# Patient Record
Sex: Male | Born: 2000 | Race: White | Hispanic: No | Marital: Single | Smoking: Never smoker
Health system: Southern US, Community
[De-identification: ages and names within clinical notes are randomized; demographics above are authoritative.]

## PROBLEM LIST (undated history)

## (undated) DIAGNOSIS — R011 Cardiac murmur, unspecified: Secondary | ICD-10-CM

## (undated) HISTORY — DX: Cardiac murmur, unspecified: R01.1

---
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
## 2019-02-01 DIAGNOSIS — Z03818 Encounter for observation for suspected exposure to other biological agents ruled out: Secondary | ICD-10-CM | POA: Diagnosis not present

## 2019-02-03 ENCOUNTER — Other Ambulatory Visit: Payer: 59

## 2019-02-04 ENCOUNTER — Other Ambulatory Visit: Payer: Self-pay

## 2019-02-04 ENCOUNTER — Ambulatory Visit: Payer: 59 | Attending: Internal Medicine

## 2019-02-04 ENCOUNTER — Other Ambulatory Visit: Payer: 59

## 2019-02-04 DIAGNOSIS — Z20828 Contact with and (suspected) exposure to other viral communicable diseases: Secondary | ICD-10-CM | POA: Diagnosis not present

## 2019-02-04 DIAGNOSIS — Z20822 Contact with and (suspected) exposure to covid-19: Secondary | ICD-10-CM

## 2019-02-06 LAB — NOVEL CORONAVIRUS, NAA: SARS-CoV-2, NAA: NOT DETECTED

## 2019-03-04 IMAGING — DX DG CHEST 2V
2 series · 2 of 2 positions shown · non-contrast
Comparison: None.

CLINICAL DATA: Chest pain

EXAM:
CHEST - 2 VIEW

[chest pa]
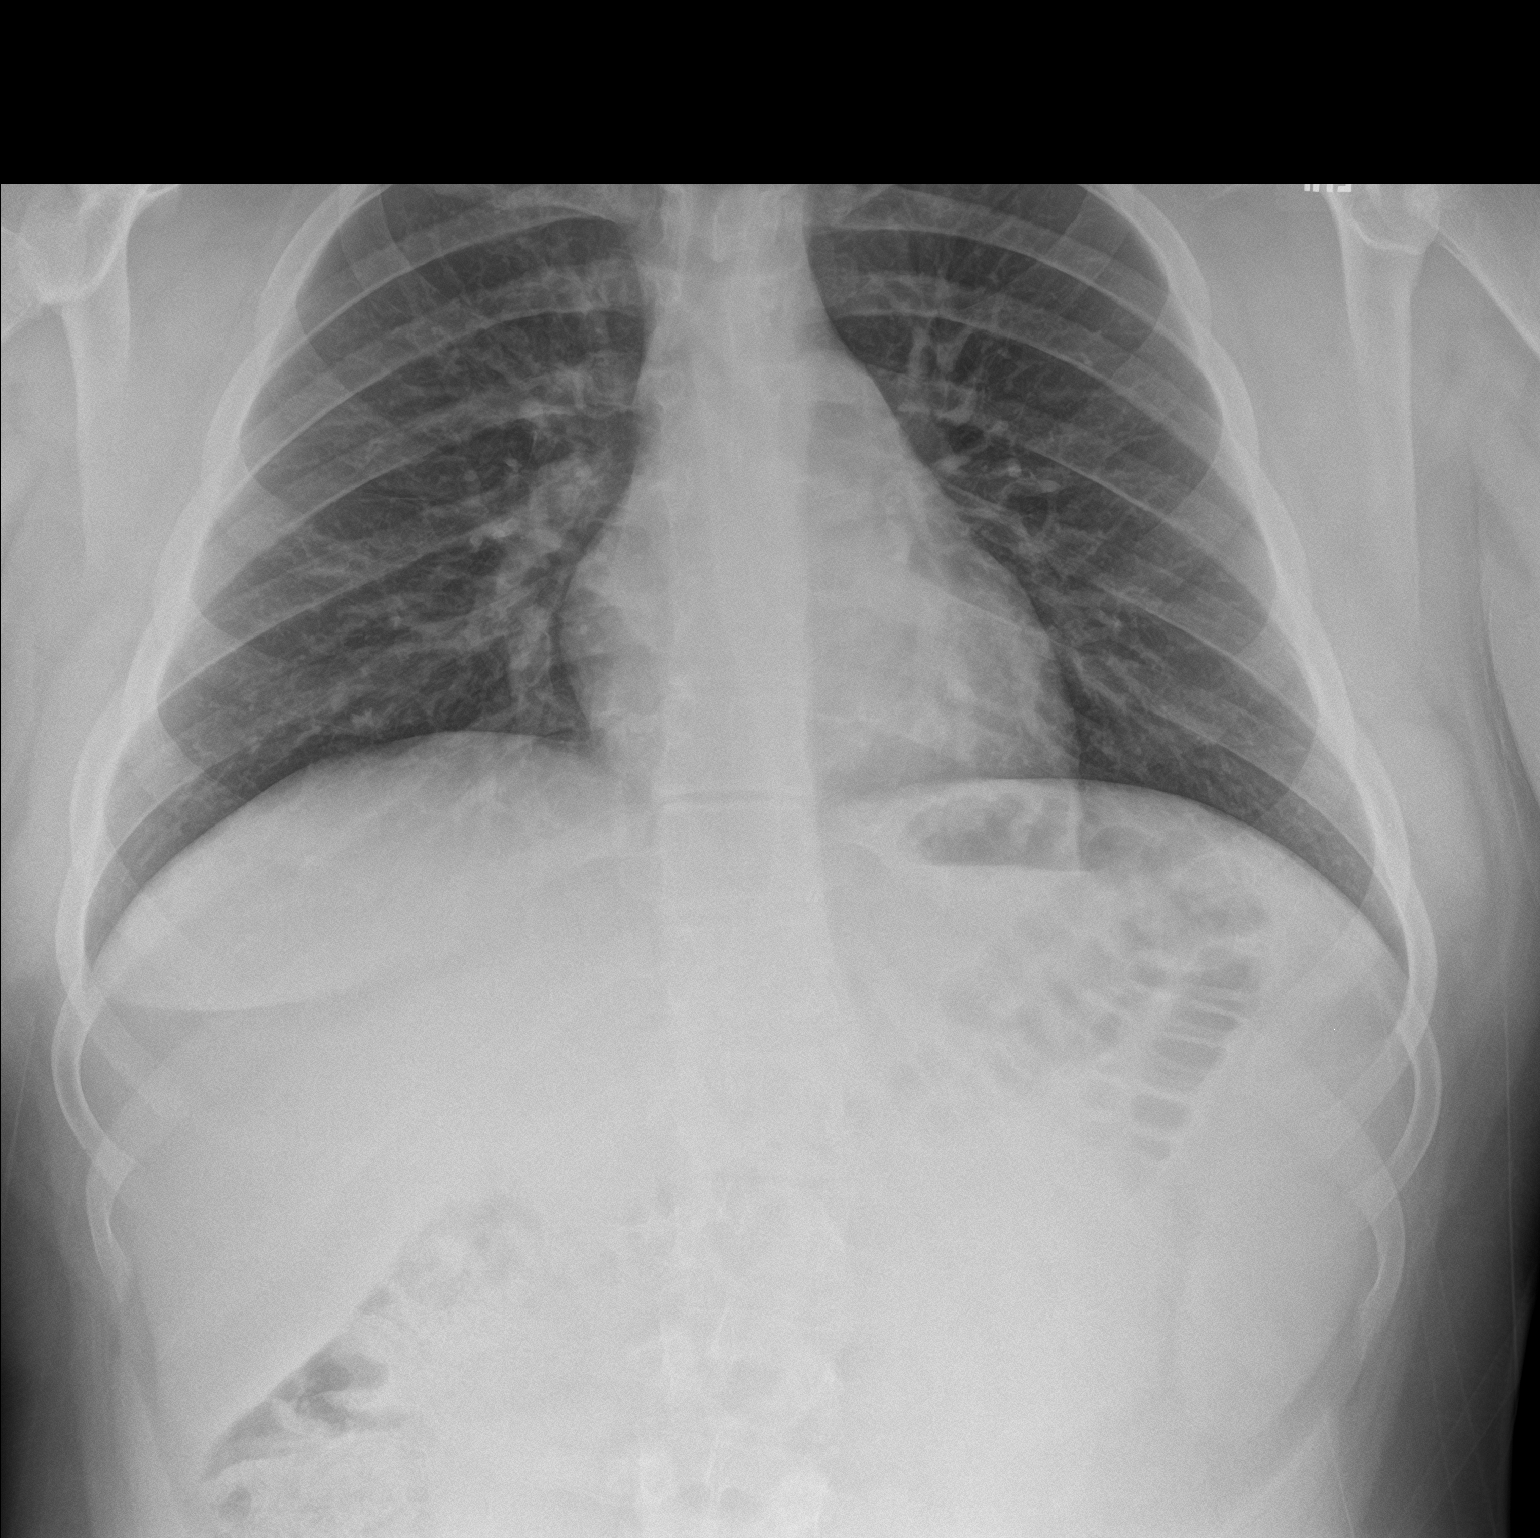

[chest lat]
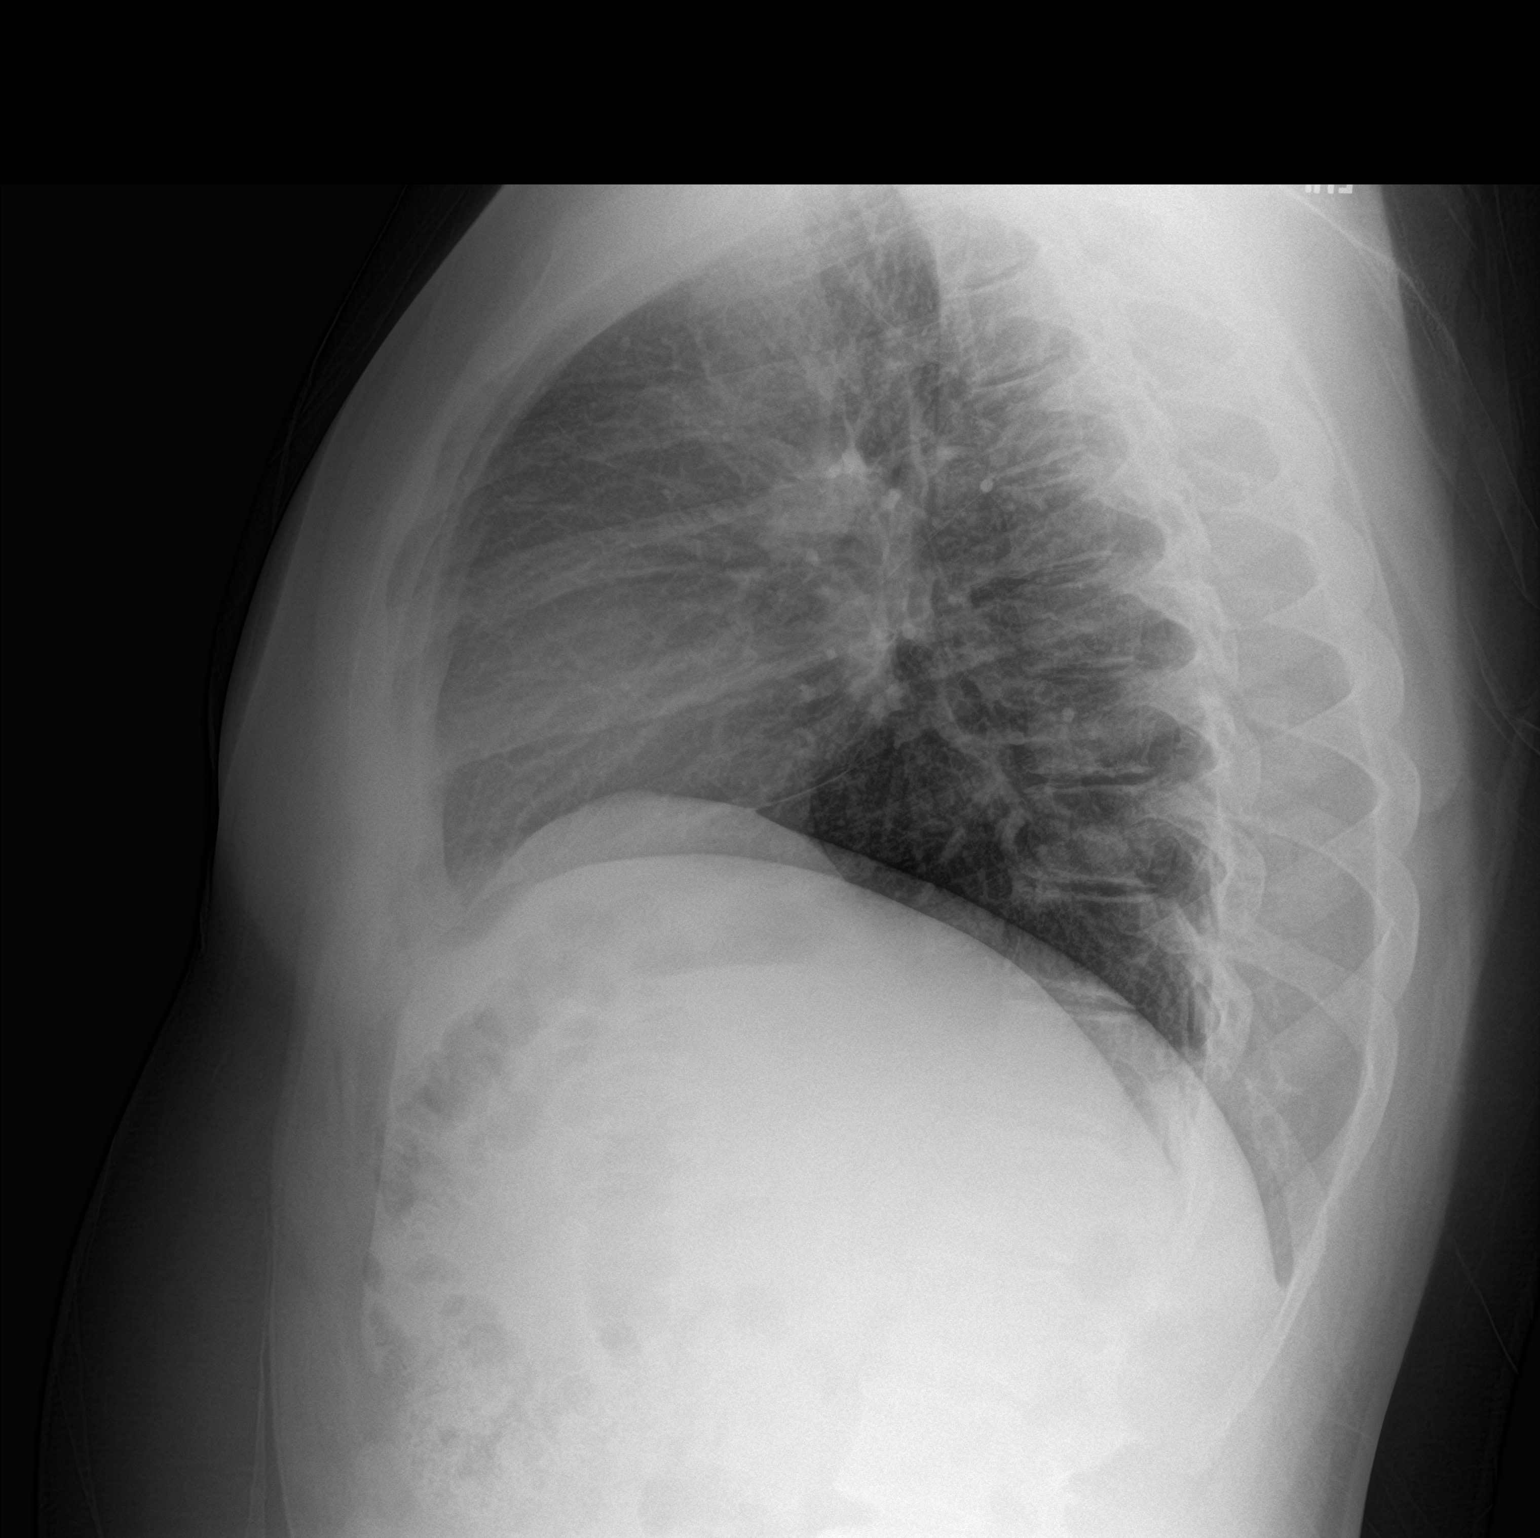

[2 of 2 positions shown; findings below may reference images not displayed]

FINDINGS: No active infiltrate or effusion is seen. Mediastinal and hilar
contours are unremarkable. The heart is within normal limits in
size. No bony abnormality is seen.
IMPRESSION: No active cardiopulmonary disease.

## 2019-03-16 ENCOUNTER — Encounter: Payer: Self-pay | Admitting: Family Medicine

## 2019-03-17 ENCOUNTER — Encounter: Payer: Self-pay | Admitting: Family Medicine

## 2019-09-16 ENCOUNTER — Ambulatory Visit (INDEPENDENT_AMBULATORY_CARE_PROVIDER_SITE_OTHER): Payer: 59 | Admitting: Nurse Practitioner

## 2019-09-16 ENCOUNTER — Other Ambulatory Visit: Payer: Self-pay

## 2019-09-16 VITALS — BP 122/82 | Temp 98.3°F | Wt 194.0 lb

## 2019-09-16 DIAGNOSIS — I499 Cardiac arrhythmia, unspecified: Secondary | ICD-10-CM | POA: Diagnosis not present

## 2019-09-16 NOTE — Progress Notes (Signed)
   Subjective:    Patient ID: David Estrada, male    DOB: 2000-06-02, 19 y.o.   MRN: [id number]  HPI  Patient arrives to discuss palpitations with exercise for several weeks. For the past 5 months patient has been working very hard on healthy habits and has lost significant amount of weight.  Has been jogging and going to the gym.  Eating healthy.  Symptoms began about 2-1/2 weeks ago.  Started with some excessive sweating possible limited hydration which he thinks caused this first episode of symptoms.  Describing the sensation like a light hitting in the middle of his chest with a possible missed beat of his pulse.  Usually occurs when his heart rate is over 160.  The highest his pulse has been with intense activity is in the 170s but he is backed off his activity to keep his heart rate lower.  Noticed it also recently about a week ago while working at his job at [employer], occurred multiple times.  Patient was not doing any significant exertion.  Denies any chest pain.  No syncope.  No edema.  No history of cardiac problems.  No family history of heart rhythm issues.  States he has a significant problem with anxiety.  Symptoms rarely occur at rest, none today.  States his resting heart rate can be in the mid 40s.  Review of Systems     Objective:   Physical Exam NAD.  Alert, oriented.  Mildly anxious affect.  Making good eye contact.  Dressed appropriately.  Thoughts logical coherent and relevant.  Lungs clear.  Heart regular rate rhythm.  Soft early systolic murmur grade 2/6 loudest at third ICS-LSB.  Abdomen soft nondistended nontender.  Lower extremities no edema.  Pulse 76 and regular. Today's Vitals   09/16/19 1509  BP: 122/82  Temp: 98.3 F (36.8 C)  TempSrc: Oral  Weight: 194 lb (88 kg)   Body mass index is 27.06 kg/m.        Assessment & Plan:  Irregular heartbeat - Plan: Ambulatory referral to Cardiology, Comprehensive metabolic panel, TSH, CBC with  Differential/Platelet  Activity as tolerated.  Discontinue activity that brings on any symptoms.  Warning signs reviewed.  Patient to go to ED if any new or worsening symptoms.  Labs pending.  Will refer to cardiology for further work-up.

## 2019-09-17 ENCOUNTER — Encounter: Payer: Self-pay | Admitting: Nurse Practitioner

## 2019-09-17 LAB — COMPREHENSIVE METABOLIC PANEL
ALT: 23 IU/L (ref 0–44)
AST: 23 IU/L (ref 0–40)
Albumin/Globulin Ratio: 1.6 (ref 1.2–2.2)
Albumin: 4.8 g/dL (ref 4.1–5.2)
Alkaline Phosphatase: 115 IU/L (ref 55–125)
BUN/Creatinine Ratio: 19 (ref 9–20)
BUN: 15 mg/dL (ref 6–20)
Bilirubin Total: 0.7 mg/dL (ref 0.0–1.2)
CO2: 24 mmol/L (ref 20–29)
Calcium: 9.6 mg/dL (ref 8.7–10.2)
Chloride: 99 mmol/L (ref 96–106)
Creatinine, Ser: 0.78 mg/dL (ref 0.76–1.27)
GFR calc Af Amer: 150 mL/min/{1.73_m2} (ref >59)
GFR calc non Af Amer: 130 mL/min/{1.73_m2} (ref >59)
Globulin, Total: 3 g/dL (ref 1.5–4.5)
Glucose: 85 mg/dL (ref 65–99)
Potassium: 4.8 mmol/L (ref 3.5–5.2)
Sodium: 138 mmol/L (ref 134–144)
Total Protein: 7.8 g/dL (ref 6.0–8.5)

## 2019-09-17 LAB — CBC WITH DIFFERENTIAL/PLATELET
Basophils Absolute: 0 10*3/uL (ref 0.0–0.2)
Basos: 1 %
EOS (ABSOLUTE): 0.2 10*3/uL (ref 0.0–0.4)
Eos: 2 %
Hematocrit: 50.1 % (ref 37.5–51.0)
Hemoglobin: 17.2 g/dL (ref 13.0–17.7)
Immature Grans (Abs): 0 10*3/uL (ref 0.0–0.1)
Immature Granulocytes: 0 %
Lymphocytes Absolute: 1.8 10*3/uL (ref 0.7–3.1)
Lymphs: 20 %
MCH: 30 pg (ref 26.6–33.0)
MCHC: 34.3 g/dL (ref 31.5–35.7)
MCV: 87 fL (ref 79–97)
Monocytes Absolute: 0.5 10*3/uL (ref 0.1–0.9)
Monocytes: 5 %
Neutrophils Absolute: 6.3 10*3/uL (ref 1.4–7.0)
Neutrophils: 72 %
Platelets: 280 10*3/uL (ref 150–450)
RBC: 5.73 x10E6/uL (ref 4.14–5.80)
RDW: 12.2 % (ref 11.6–15.4)
WBC: 8.7 10*3/uL (ref 3.4–10.8)

## 2019-09-17 LAB — TSH: TSH: 1.62 u[IU]/mL (ref 0.450–4.500)

## 2019-10-30 NOTE — Progress Notes (Signed)
Cardiology Office Note:   Date:  10/31/2019  NAME:  David Estrada    MRN: [id number] DOB:  [birth year]   PCP:  Annalee Genta, DO  Cardiologist:  No primary care provider on file.   Referring MD: Campbell Riches, NP   Chief Complaint  Patient presents with  . Palpitations   History of Present Illness:   David Estrada is a 19 y.o. male without history who is being seen today for the evaluation of palpitations at the request of Annalee Genta, DO. Thyroid studies normal. TSH 1.62.  He presents for evaluation of palpitations.  Roughly 6 months ago he reports he started an intense exercise program and lost nearly 56 pounds.  He would notice his heart skipping a beat when he would run.  He reports symptoms used to occur daily but now occur less.  He started back exercising again and has not noticed any palpitations.  He was told he had a murmur by his primary care physician and then referred to [place].  His EKG demonstrates normal sinus rhythm with no acute ST-T changes or evidence of prior infarction.  He has no history of syncope.  There is no family history of sudden cardiac death.  There is no strong family history of heart disease.  Apparently diabetes and hypertension run in the family.  His most recent thyroid studies were normal.  He does not smoke, use drugs or drink alcohol.  He does have a 2 out of 6 holosystolic murmur present on exam.  No echocardiogram in our system.  EKG shows normal sinus rhythm with no ischemic changes.  He denies chest pain, shortness of breath or palpitations in office.  Past Medical History: Past Medical History:  Diagnosis Date  . Heart murmur     Past Surgical History: History reviewed. No pertinent surgical history.  Current Medications: No outpatient medications have been marked as taking for the 10/31/19 encounter (Office Visit) with Sande Rives, MD.     Allergies:    Patient has no known allergies.   Social History: Social History     Socioeconomic History  . Marital status: Single    Spouse name: Not on file  . Number of children: Not on file  . Years of education: Not on file  . Highest education level: Not on file  Occupational History  . Not on file  Tobacco Use  . Smoking status: Never Smoker  . Smokeless tobacco: Never Used  Substance and Sexual Activity  . Alcohol use: Never  . Drug use: Never  . Sexual activity: Not on file  Other Topics Concern  . Not on file  Social History Narrative  . Not on file   Social Determinants of Health   Financial Resource Strain:   . Difficulty of Paying Living Expenses: Not on file  Food Insecurity:   . Worried About Programme researcher, broadcasting/film/video in the Last Year: Not on file  . Ran Out of Food in the Last Year: Not on file  Transportation Needs:   . Lack of Transportation (Medical): Not on file  . Lack of Transportation (Non-Medical): Not on file  Physical Activity:   . Days of Exercise per Week: Not on file  . Minutes of Exercise per Session: Not on file  Stress:   . Feeling of Stress : Not on file  Social Connections:   . Frequency of Communication with Friends and Family: Not on file  . Frequency of Social Gatherings  with Friends and Family: Not on file  . Attends Religious Services: Not on file  . Active Member of Clubs or Organizations: Not on file  . Attends BankerClub or Organization Meetings: Not on file  . Marital Status: Not on file     Family History: The patient's family history includes Diabetes in his father; Hyperlipidemia in his mother.  ROS:   All other ROS reviewed and negative. Pertinent positives noted in the HPI.     EKGs/Labs/Other Studies Reviewed:   The following studies were personally reviewed by me today:  EKG:  EKG is ordered today.  The ekg ordered today demonstrates normal sinus rhythm, heart rate 67, no acute ischemic changes, no evidence of prior infarction, normal EKG, and was personally reviewed by me.   Recent Labs: 09/16/2019:  ALT 23; BUN 15; Creatinine, Ser 0.78; Hemoglobin 17.2; Platelets 280; Potassium 4.8; Sodium 138; TSH 1.620   Recent Lipid Panel    Component Value Date/Time   CHOL 147 08/10/2017 1014   TRIG 123 (H) 08/10/2017 1014   HDL 37 (L) 08/10/2017 1014   CHOLHDL 4.0 08/10/2017 1014   LDLCALC 85 08/10/2017 1014    Physical Exam:   VS:  BP 118/76   Pulse 67   Ht 5\' 11"  (1.803 m)   Wt 189 lb 6.4 oz (85.9 kg)   SpO2 98%   BMI 26.42 kg/m    Wt Readings from Last 3 Encounters:  10/31/19 189 lb 6.4 oz (85.9 kg)  09/16/19 194 lb (88 kg)  02/22/18 249 lb (112.9 kg) (>99 %, Z= 2.42)*   * Growth percentiles are based on CDC (Boys, 2-20 Years) data.    General: Well nourished, well developed, in no acute distress Heart: Atraumatic, normal size  Eyes: PEERLA, EOMI  Neck: Supple, no JVD Endocrine: No thryomegaly Cardiac: Normal S1, S2; 2 out of 6 holosystolic murmur Lungs: Clear to auscultation bilaterally, no wheezing, rhonchi or rales  Abd: Soft, nontender, no hepatomegaly  Ext: No edema, pulses 2+ Musculoskeletal: No deformities, BUE and BLE strength normal and equal Skin: Warm and dry, no rashes   Neuro: Alert and oriented to person, place, time, and situation, CNII-XII grossly intact, no focal deficits  Psych: Normal mood and affect   ASSESSMENT:   David Estrada is a 19 y.o. male who presents for the following: 1. Palpitations   2. Murmur    PLAN:   1. Palpitations 2. Murmur -Symptoms have improved.  Had palpitations with exercise.  He did have some palpitations when he took a deep inspiration and I noticed extra heartbeats on his exam.  He does have a 2 out of 6 holosystolic murmur.  I suspect this is an innocent flow murmur.  He has a normal EKG and no evidence of cardiovascular disease on exam.  I did perform a limited bedside echo in the office which shows no evidence of hypertrophic cardiomyopathy.  Will need a formal echo to confirm this.  I suspect he is having PACs or PVCs.   We will proceed with a 3-day Zio patch.  Symptoms have improved and he can continue to exercise.  I will see him back in 3 months after the above testing.  Disposition: Return in about 3 months (around 01/30/2020).  Medication Adjustments/Labs and Tests Ordered: Current medicines are reviewed at length with the patient today.  Concerns regarding medicines are outlined above.  Orders Placed This Encounter  Procedures  . LONG TERM MONITOR (3-14 DAYS)  . ECHOCARDIOGRAM COMPLETE  No orders of the defined types were placed in this encounter.   Patient Instructions  Medication Instructions:  The current medical regimen is effective;  continue present plan and medications.  *If you need a refill on your cardiac medications before your next appointment, please call your pharmacy*   Testing/Procedures: Echocardiogram - Your physician has requested that you have an echocardiogram. Echocardiography is a painless test that uses sound waves to create images of your heart. It provides your doctor with information about the size and shape of your heart and how well your heart's chambers and valves are working. This procedure takes approximately one hour. There are no restrictions for this procedure. This will be performed at our [facility] location - [address].  Your physician has recommended that you wear a 3 DAY ZIO-PATCH monitor. The Zio patch cardiac monitor continuously records heart rhythm data for up to 14 days, this is for patients being evaluated for multiple types heart rhythms. For the first 24 hours post application, please avoid getting the Zio monitor wet in the shower or by excessive sweating during exercise. After that, feel free to carry on with regular activities. Keep soaps and lotions away from the ZIO XT Patch.  This will be mailed to you, please expect 7-10 days to receive.    Applying the monitor   Shave hair from upper left chest.   Hold abrader disc by  orange tab.  Rub abrader in 40 strokes over left upper chest as indicated in your monitor instructions.   Clean area with 4 enclosed alcohol pads .  Use all pads to assure are is cleaned thoroughly.  Let dry.   Apply patch as indicated in monitor instructions.  Patch will be place under collarbone on left side of chest with arrow pointing upward.   Rub patch adhesive wings for 2 minutes.Remove white label marked "1".  Remove white label marked "2".  Rub patch adhesive wings for 2 additional minutes.   While looking in a mirror, press and release button in center of patch.  A small green light will flash 3-4 times .  This will be your only indicator the monitor has been turned on.     Do not shower for the first 24 hours.  You may shower after the first 24 hours.   Press button if you feel a symptom. You will hear a small click.  Record Date, Time and Symptom in the Patient Log Book.   When you are ready to remove patch, follow instructions on last 2 pages of Patient Log Book.  Stick patch monitor onto last page of Patient Log Book.   Place Patient Log Book in [place] box.  Use locking tab on box and tape box closed securely.  The Orange and [employer] has [employer] on it.  Please place in mailbox as soon as possible.  Your physician should have your test results approximately 7 days after the monitor has been mailed back to [facility]   Call [facility] Customer Care at [phone] if you have questions regarding your ZIO XT patch monitor.  Call them immediately if you see an orange light blinking on your monitor.   If your monitor falls off in less than 4 days contact our Monitor department at [phone].  If your monitor becomes loose or falls off after 4 days call [employer] at [phone] for suggestions on securing your monitor     Follow-Up: At [facility], you and your  health needs are our priority.  As part of our continuing mission to provide you with exceptional  heart care, we have created designated Provider Care Teams.  These Care Teams include your primary Cardiologist (physician) and Advanced Practice Providers (APPs -  Physician Assistants and Nurse Practitioners) who all work together to provide you with the care you need, when you need it.  We recommend signing up for the patient portal called "MyChart".  Sign up information is provided on this After Visit Summary.  MyChart is used to connect with patients for Virtual Visits (Telemedicine).  Patients are able to view lab/test results, encounter notes, upcoming appointments, etc.  Non-urgent messages can be sent to your provider as well.   To learn more about what you can do with MyChart, go to [email or web address].    Your next appointment:   3 month(s)  The format for your next appointment:   In Person  Provider:   Lennie Odor, MD       Signed, Lenna Gilford. Flora Lipps, MD [facility]  [address], [address] [place], [place] [address] [phone]  10/31/2019 2:36 PM

## 2019-10-31 ENCOUNTER — Encounter: Payer: Self-pay | Admitting: Cardiovascular Disease

## 2019-10-31 ENCOUNTER — Encounter: Payer: Self-pay | Admitting: *Deleted

## 2019-10-31 ENCOUNTER — Other Ambulatory Visit: Payer: Self-pay

## 2019-10-31 ENCOUNTER — Ambulatory Visit: Payer: 59 | Admitting: Cardiovascular Disease

## 2019-10-31 VITALS — BP 118/76 | HR 67 | Ht 71.0 in | Wt 189.4 lb

## 2019-10-31 DIAGNOSIS — R011 Cardiac murmur, unspecified: Secondary | ICD-10-CM

## 2019-10-31 DIAGNOSIS — R002 Palpitations: Secondary | ICD-10-CM | POA: Diagnosis not present

## 2019-10-31 NOTE — Patient Instructions (Signed)
Medication Instructions:  The current medical regimen is effective;  continue present plan and medications.  *If you need a refill on your cardiac medications before your next appointment, please call your pharmacy*   Testing/Procedures: Echocardiogram - Your physician has requested that you have an echocardiogram. Echocardiography is a painless test that uses sound waves to create images of your heart. It provides your doctor with information about the size and shape of your heart and how well your hearts chambers and valves are working. This procedure takes approximately one hour. There are no restrictions for this procedure. This will be performed at our [facility] location - [address].  Your physician has recommended that you wear a 3 DAY ZIO-PATCH monitor. The Zio patch cardiac monitor continuously records heart rhythm data for up to 14 days, this is for patients being evaluated for multiple types heart rhythms. For the first 24 hours post application, please avoid getting the Zio monitor wet in the shower or by excessive sweating during exercise. After that, feel free to carry on with regular activities. Keep soaps and lotions away from the ZIO XT Patch.  This will be mailed to you, please expect 7-10 days to receive.    Applying the monitor   Shave hair from upper left chest.   Hold abrader disc by orange tab.  Rub abrader in 40 strokes over left upper chest as indicated in your monitor instructions.   Clean area with 4 enclosed alcohol pads .  Use all pads to assure are is cleaned thoroughly.  Let dry.   Apply patch as indicated in monitor instructions.  Patch will be place under collarbone on left side of chest with arrow pointing upward.   Rub patch adhesive wings for 2 minutes.Remove white label marked "1".  Remove white label marked "2".  Rub patch adhesive wings for 2 additional minutes.   While looking in a mirror, press and release button in center of patch.   A small green light will flash 3-4 times .  This will be your only indicator the monitor has been turned on.     Do not shower for the first 24 hours.  You may shower after the first 24 hours.   Press button if you feel a symptom. You will hear a small click.  Record Date, Time and Symptom in the Patient Log Book.   When you are ready to remove patch, follow instructions on last 2 pages of Patient Log Book.  Stick patch monitor onto last page of Patient Log Book.   Place Patient Log Book in [place] box.  Use locking tab on box and tape box closed securely.  The [employer] and [employer] has [employer] on it.  Please place in mailbox as soon as possible.  Your physician should have your test results approximately 7 days after the monitor has been mailed back to [facility].   Call [facility] Customer Care at [phone] if you have questions regarding your ZIO XT patch monitor.  Call them immediately if you see an orange light blinking on your monitor.   If your monitor falls off in less than 4 days contact our Monitor department at [phone].  If your monitor becomes loose or falls off after 4 days call [employer] at [phone] for suggestions on securing your monitor     Follow-Up: At [facility], you and your health needs are our priority.  As part of our continuing mission to provide you with  exceptional heart care, we have created designated Provider Care Teams.  These Care Teams include your primary Cardiologist (physician) and Advanced Practice Providers (APPs -  Physician Assistants and Nurse Practitioners) who all work together to provide you with the care you need, when you need it.  We recommend signing up for the patient portal called "MyChart".  Sign up information is provided on this After Visit Summary.  MyChart is used to connect with patients for Virtual Visits (Telemedicine).  Patients are able to view lab/test results, encounter notes, upcoming appointments, etc.   Non-urgent messages can be sent to your provider as well.   To learn more about what you can do with MyChart, go to [email or web address].    Your next appointment:   3 month(s)  The format for your next appointment:   In Person  Provider:   Lennie Odor, MD

## 2019-10-31 NOTE — Progress Notes (Signed)
Patient ID: David Estrada, male   DOB: 2000/03/15, 19 y.o.   MRN: [id number] Patient enrolled for [employer] to ship a 3 day ZIO XT long term holter monitor to his home.

## 2019-11-02 ENCOUNTER — Other Ambulatory Visit (HOSPITAL_COMMUNITY): Payer: 59

## 2019-11-02 ENCOUNTER — Ambulatory Visit (HOSPITAL_COMMUNITY)
Admission: RE | Admit: 2019-11-02 | Discharge: 2019-11-02 | Disposition: A | Discharge status: Home / Self Care | Payer: 59 | Source: Ambulatory Visit | Attending: Cardiovascular Disease | Admitting: Cardiovascular Disease

## 2019-11-02 ENCOUNTER — Other Ambulatory Visit: Payer: Self-pay

## 2019-11-02 DIAGNOSIS — R002 Palpitations: Secondary | ICD-10-CM | POA: Insufficient documentation

## 2019-11-02 LAB — ECHOCARDIOGRAM COMPLETE
AR max vel: 3.04 cm2
AV Area VTI: 3.07 cm2
AV Area mean vel: 2.89 cm2
AV Mean grad: 5.8 mmHg
AV Peak grad: 12.3 mmHg
Ao pk vel: 1.75 m/s
Area-P 1/2: 3.95 cm2
S' Lateral: 3.09 cm

## 2019-11-02 NOTE — Progress Notes (Signed)
*  PRELIMINARY RESULTS* Echocardiogram 2D Echocardiogram has been performed.  [name] 11/02/2019, 10:02 AM

## 2019-11-07 ENCOUNTER — Ambulatory Visit (INDEPENDENT_AMBULATORY_CARE_PROVIDER_SITE_OTHER): Payer: 59

## 2019-11-07 DIAGNOSIS — R002 Palpitations: Secondary | ICD-10-CM | POA: Diagnosis not present

## 2019-11-09 NOTE — Addendum Note (Signed)
Addended by: Myna Hidalgo A on: 11/09/2019 02:54 PM   Modules accepted: Orders

## 2019-11-15 DIAGNOSIS — R002 Palpitations: Secondary | ICD-10-CM | POA: Diagnosis not present

## 2020-01-05 ENCOUNTER — Other Ambulatory Visit: Payer: Self-pay

## 2020-01-05 ENCOUNTER — Ambulatory Visit (INDEPENDENT_AMBULATORY_CARE_PROVIDER_SITE_OTHER): Payer: 59 | Admitting: Family Medicine

## 2020-01-05 ENCOUNTER — Encounter: Payer: Self-pay | Admitting: Family Medicine

## 2020-01-05 VITALS — BP 118/72 | HR 76 | Temp 96.6°F | Ht 70.5 in | Wt 185.8 lb

## 2020-01-05 DIAGNOSIS — I493 Ventricular premature depolarization: Secondary | ICD-10-CM

## 2020-01-05 DIAGNOSIS — Z0000 Encounter for general adult medical examination without abnormal findings: Secondary | ICD-10-CM | POA: Diagnosis not present

## 2020-01-05 DIAGNOSIS — Z8679 Personal history of other diseases of the circulatory system: Secondary | ICD-10-CM

## 2020-01-05 NOTE — Progress Notes (Signed)
Patient ID: David Estrada, male    DOB: 2000/08/09, 19 y.o.   MRN: [id number]   Chief Complaint  Patient presents with  . Annual Exam   Subjective:    HPI The patient comes in today for a wellness visit.  A review of their health history was completed.  A review of medications was also completed.  Any needed refills; none  Eating habits: eating healthy  Falls/  MVA accidents in past few months: none  Regular exercise: diet and gym  Specialist pt sees on regular basis: none  Preventative health issues were discussed.   Additional concerns: BLET form to be completed.  Seen by cards, pvcs and did echo and monitor. Going back 12/21 to recheck. Was happening more during exercising. Normal at this time.   Medical History Ha has a past medical history of Heart murmur.   No outpatient encounter medications on file as of 01/05/2020.   No facility-administered encounter medications on file as of 01/05/2020.     Review of Systems  Constitutional: Negative for chills and fever.  HENT: Negative for congestion, rhinorrhea and sore throat.   Respiratory: Negative for cough, shortness of breath and wheezing.   Cardiovascular: Negative for chest pain and leg swelling.  Gastrointestinal: Negative for abdominal pain, diarrhea, nausea and vomiting.  Genitourinary: Negative for dysuria and frequency.  Skin: Negative for rash.  Neurological: Negative for dizziness, weakness and headaches.     Vitals BP 118/72   Pulse 76   Temp (!) 96.6 F (35.9 C)   Ht 5' 10.5" (1.791 m)   Wt 185 lb 12.8 oz (84.3 kg)   SpO2 98%   BMI 26.28 kg/m   Objective:   Physical Exam Vitals and nursing note reviewed.  Constitutional:      General: He is not in acute distress.    Appearance: Normal appearance. He is not ill-appearing.  HENT:     Head: Normocephalic.     Nose: Nose normal. No congestion.     Mouth/Throat:     Mouth: Mucous membranes are moist.     Pharynx: No  oropharyngeal exudate.  Eyes:     Extraocular Movements: Extraocular movements intact.     Conjunctiva/sclera: Conjunctivae normal.     Pupils: Pupils are equal, round, and reactive to light.  Cardiovascular:     Rate and Rhythm: Normal rate and regular rhythm.     Pulses: Normal pulses.     Heart sounds: Normal heart sounds. No murmur heard.   Pulmonary:     Effort: Pulmonary effort is normal.     Breath sounds: Normal breath sounds. No wheezing, rhonchi or rales.  Musculoskeletal:        General: Normal range of motion.     Right lower leg: No edema.     Left lower leg: No edema.  Skin:    General: Skin is warm and dry.     Findings: No rash.  Neurological:     General: No focal deficit present.     Mental Status: He is alert and oriented to person, place, and time.     Cranial Nerves: No cranial nerve deficit.  Psychiatric:        Mood and Affect: Mood normal.        Behavior: Behavior normal.        Thought Content: Thought content normal.        Judgment: Judgment normal.      Assessment and Plan   1.  Well adult exam  2. PVC's (premature ventricular contractions)  3. 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 Pt declining flu vaccine.  F/u 1 yr or prn.

## 2020-02-07 NOTE — Progress Notes (Signed)
Cardiology Office Note:   Date:  02/09/2020  NAME:  David Estrada    MRN: [id number] DOB:  2000-11-10   PCP:  Annalee Genta, DO  Cardiologist:  No primary care provider on file.   Referring MD: Annalee Genta, DO   Chief Complaint  Patient presents with  . Follow-up   History of Present Illness:   David Estrada is a 19 y.o. male without medical history who presents for follow-up of palpitations. Seen September for palpitations. Zio normal. Echo normal. TSH normal.  He reports he is doing well since her last visit.  Still having episodes where his heart can get pretty high with exertion.  He is not exercising as much but symptoms have improved.  I did go over the results of his echocardiogram which showed normal LV function and no significant valvular heart disease.  He also had a normal heart monitor.  He describes no chest pain or shortness of breath.  His symptoms did occur after his second dose of the Moderna vaccine.  He has had no chest pain suggest myocarditis.  His EKGs have been normal.  There is no arrhythmia seen on his monitor.  Echocardiogram also normal.  I have a low suspicion for myocarditis.  I did counsel him extensively that it is normal for his heart rate to get up in the 100 at 190 bpm range.  He should stay well-hydrated and continue to exercise.  Overall appears to be doing well.  We did discuss the vaccine.  I did discuss that a booster is recommended.  He has concerns about myocarditis.  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  The myocarditis that is seen with vaccination since to be mild.  However, he will wait a decision on whether he will get the booster or not.   Past Medical History: Past Medical History:  Diagnosis Date  . Heart murmur     Past Surgical History: History reviewed. No pertinent surgical history.  Current Medications: No outpatient medications have been marked as taking for the 02/09/20 encounter (Office Visit) with Sande Rives, MD.     Allergies:    Patient has no known allergies.   Social History: Social History   Socioeconomic History  . Marital status: Single    Spouse name: Not on file  . Number of children: Not on file  . Years of education: Not on file  . Highest education level: Not on file  Occupational History  . Not on file  Tobacco Use  . Smoking status: Never Smoker  . Smokeless tobacco: Never Used  Substance and Sexual Activity  . Alcohol use: Never  . Drug use: Never  . Sexual activity: Not on file  Other Topics Concern  . Not on file  Social History Narrative  . Not on file   Social Determinants of Health   Financial Resource Strain: Not on file  Food Insecurity: Not on file  Transportation Needs: Not on file  Physical Activity: Not on file  Stress: Not on file  Social Connections: Not on file     Family History: The patient's family history includes Diabetes in his father; Hyperlipidemia in his mother.  ROS:   All other ROS reviewed and negative. Pertinent positives noted in the HPI.     EKGs/Labs/Other Studies Reviewed:   The following studies were personally reviewed by me today:   Zio 11/20/2019  1. No significant arrhythmia detected.  2. Symptoms coincided with  normal sinus rhythm.  3. Rare ectopy.    TTE 11/02/2019 1. Left ventricular ejection fraction, by estimation, is 65 to 70%. The  left ventricle has normal function. The left ventricle has no regional  wall motion abnormalities. Left ventricular diastolic parameters were  normal.  2. Right ventricular systolic function is normal. The right ventricular  size is normal.  3. The mitral valve is normal in structure. No evidence of mitral valve  regurgitation. No evidence of mitral stenosis.  4. The aortic valve is tricuspid. Aortic valve regurgitation is not  visualized. No aortic stenosis is present.  5. The inferior vena cava is normal in size with greater than 50%  respiratory  variability, suggesting right atrial pressure of 3 mmHg.   Recent Labs: 09/16/2019: ALT 23; BUN 15; Creatinine, Ser 0.78; Hemoglobin 17.2; Platelets 280; Potassium 4.8; Sodium 138; TSH 1.620   Recent Lipid Panel    Component Value Date/Time   CHOL 147 08/10/2017 1014   TRIG 123 (H) 08/10/2017 1014   HDL 37 (L) 08/10/2017 1014   CHOLHDL 4.0 08/10/2017 1014   LDLCALC 85 08/10/2017 1014    Physical Exam:   VS:  BP 127/61   Pulse 70   Ht 5' 11.5" (1.816 m)   Wt 194 lb 6.4 oz (88.2 kg)   SpO2 100%   BMI 26.74 kg/m    Wt Readings from Last 3 Encounters:  02/09/20 194 lb 6.4 oz (88.2 kg)  01/05/20 185 lb 12.8 oz (84.3 kg)  10/31/19 189 lb 6.4 oz (85.9 kg)    General: Well nourished, well developed, in no acute distress Head: Atraumatic, normal size  Eyes: PEERLA, EOMI  Neck: Supple, no JVD Endocrine: No thryomegaly Cardiac: Normal S1, S2; RRR; no murmurs, rubs, or gallops Lungs: Clear to auscultation bilaterally, no wheezing, rhonchi or rales  Abd: Soft, nontender, no hepatomegaly  Ext: No edema, pulses 2+ Musculoskeletal: No deformities, BUE and BLE strength normal and equal Skin: Warm and dry, no rashes   Neuro: Alert and oriented to person, place, time, and situation, CNII-XII grossly intact, no focal deficits  Psych: Normal mood and affect   ASSESSMENT:   David Estrada is a 19 y.o. male who presents for the following: 1. Palpitations     PLAN:   1. Palpitations -Normal monitor.  Normal thyroid study.  Normal echo.  Normal cardiovascular exam.  I recommended he maintain adequate hydration and continue to exercise.  Unclear if his symptoms are related to the vaccine.  He has no chest pain or shortness of breath concerning for myocarditis.  His monitor would have also shown something concerning if there was sinister pathology underneath.  Overall I recommended no further cardiac testing.  He will see [place] as needed.  Disposition: Return if symptoms worsen or fail to  improve.  Medication Adjustments/Labs and Tests Ordered: Current medicines are reviewed at length with the patient today.  Concerns regarding medicines are outlined above.  No orders of the defined types were placed in this encounter.  No orders of the defined types were placed in this encounter.   Patient Instructions  Medication Instructions:  The current medical regimen is effective;  continue present plan and medications.  *If you need a refill on your cardiac medications before your next appointment, please call your pharmacy*  Follow-Up: At [facility], you and your health needs are our priority.  As part of our continuing mission to provide you with exceptional heart care, we have created designated Provider Care  Teams.  These Care Teams include your primary Cardiologist (physician) and Advanced Practice Providers (APPs -  Physician Assistants and Nurse Practitioners) who all work together to provide you with the care you need, when you need it.  We recommend signing up for the patient portal called "MyChart".  Sign up information is provided on this After Visit Summary.  MyChart is used to connect with patients for Virtual Visits (Telemedicine).  Patients are able to view lab/test results, encounter notes, upcoming appointments, etc.  Non-urgent messages can be sent to your provider as well.   To learn more about what you can do with MyChart, go to [email or web address].    Your next appointment:   As needed  The format for your next appointment:   In Person  Provider:   Lennie Odor, MD        Time Spent with Patient: I have spent a total of 25 minutes with patient reviewing hospital notes, telemetry, EKGs, labs and examining the patient as well as establishing an assessment and plan that was discussed with the patient.  > 50% of time was spent in direct patient care.  Signed, Lenna Gilford. Flora Lipps, MD [facility]  [address] [place], [place] [address] [phone]  02/09/2020 11:34 AM

## 2020-02-09 ENCOUNTER — Encounter: Payer: Self-pay | Admitting: Cardiovascular Disease

## 2020-02-09 ENCOUNTER — Ambulatory Visit (INDEPENDENT_AMBULATORY_CARE_PROVIDER_SITE_OTHER): Payer: 59 | Admitting: Cardiovascular Disease

## 2020-02-09 ENCOUNTER — Other Ambulatory Visit: Payer: Self-pay

## 2020-02-09 VITALS — BP 127/61 | HR 70 | Ht 71.5 in | Wt 194.4 lb

## 2020-02-09 DIAGNOSIS — R002 Palpitations: Secondary | ICD-10-CM

## 2020-02-09 NOTE — Patient Instructions (Signed)
Medication Instructions:  The current medical regimen is effective;  continue present plan and medications.  *If you need a refill on your cardiac medications before your next appointment, please call your pharmacy*    Follow-Up: At [facility], you and your health needs are our priority.  As part of our continuing mission to provide you with exceptional heart care, we have created designated Provider Care Teams.  These Care Teams include your primary Cardiologist (physician) and Advanced Practice Providers (APPs -  Physician Assistants and Nurse Practitioners) who all work together to provide you with the care you need, when you need it.  We recommend signing up for the patient portal called "MyChart".  Sign up information is provided on this After Visit Summary.  MyChart is used to connect with patients for Virtual Visits (Telemedicine).  Patients are able to view lab/test results, encounter notes, upcoming appointments, etc.  Non-urgent messages can be sent to your provider as well.   To learn more about what you can do with MyChart, go to [email or web address].    Your next appointment:   As needed  The format for your next appointment:   In Person  Provider:   Kelley O'Neal, MD      

## 2020-11-21 ENCOUNTER — Telehealth: Payer: Self-pay | Admitting: Family Medicine

## 2020-11-21 NOTE — Telephone Encounter (Signed)
Patients mother called in for patient requesting immunization records.  CB# [phone]

## 2020-11-21 NOTE — Telephone Encounter (Signed)
Vaccine record up front for pick up. Patient notified.

## 2020-12-07 ENCOUNTER — Encounter: Payer: Self-pay | Admitting: Family Medicine

## 2020-12-07 ENCOUNTER — Ambulatory Visit (INDEPENDENT_AMBULATORY_CARE_PROVIDER_SITE_OTHER): Payer: 59 | Admitting: Family Medicine

## 2020-12-07 ENCOUNTER — Other Ambulatory Visit: Payer: Self-pay

## 2020-12-07 VITALS — BP 142/82 | HR 68 | Temp 97.6°F | Ht 72.0 in | Wt 211.4 lb

## 2020-12-07 DIAGNOSIS — Z87898 Personal history of other specified conditions: Secondary | ICD-10-CM | POA: Insufficient documentation

## 2020-12-07 DIAGNOSIS — R030 Elevated blood-pressure reading, without diagnosis of hypertension: Secondary | ICD-10-CM | POA: Diagnosis not present

## 2020-12-07 DIAGNOSIS — J019 Acute sinusitis, unspecified: Secondary | ICD-10-CM | POA: Diagnosis not present

## 2020-12-07 DIAGNOSIS — Z23 Encounter for immunization: Secondary | ICD-10-CM | POA: Diagnosis not present

## 2020-12-07 DIAGNOSIS — J329 Chronic sinusitis, unspecified: Secondary | ICD-10-CM | POA: Insufficient documentation

## 2020-12-07 MED ORDER — IPRATROPIUM BROMIDE 0.06 % NA SOLN
2.0000 | Freq: Four times a day (QID) | NASAL | 0 refills | Status: DC | PRN
Start: 2020-12-07 — End: 2023-01-09

## 2020-12-07 MED ORDER — AMOXICILLIN-POT CLAVULANATE 875-125 MG PO TABS: 1.0000 | ORAL_TABLET | Freq: Two times a day (BID) | ORAL | 0 refills | Status: DC

## 2020-12-07 NOTE — Patient Instructions (Signed)
Medication as prescribed.  Follow up annually.  Take care  Dr. Torrian Canion  

## 2020-12-07 NOTE — Progress Notes (Signed)
Subjective:  Patient ID: David Estrada, male    DOB: 11/16/2000  Age: 20 y.o. MRN: [id number]  CC: Blood pressure issue; sinus issue   HPI:  20 year old male presents for evaluation the above.  Patient is trying to join the [employer].  He states that his blood pressures have been elevated on more than 1 occurrence.  He was advised to seek medical attention regarding this.  His blood pressures have to be below 140/90.  He has previously been seen by cardiology for palpitations.  He has had an echocardiogram and Holter monitor.  His blood pressures at home are normal/well-controlled.  He needs a note reflecting this for his employer so that he can proceed.  He feels well.  Denies chest pain.  Denies shortness of breath.  However, he is currently suffering from a sinus issue.  He reports ongoing nasal congestion for the past 2 weeks.  He has had some discolored nasal discharge.  He otherwise feels well.  No fever.  No relieving factors.  Social Hx   Social History   Socioeconomic History   Marital status: Single    Spouse name: Not on file   Number of children: Not on file   Years of education: Not on file   Highest education level: Not on file  Occupational History   Not on file  Tobacco Use   Smoking status: Never   Smokeless tobacco: Never  Substance and Sexual Activity   Alcohol use: Never   Drug use: Never   Sexual activity: Not on file  Other Topics Concern   Not on file  Social History Narrative   Not on file   Social Determinants of Health   Financial Resource Strain: Not on file  Food Insecurity: Not on file  Transportation Needs: Not on file  Physical Activity: Not on file  Stress: Not on file  Social Connections: Not on file    Review of Systems  Constitutional:  Negative for fever.  HENT:  Positive for congestion.   Cardiovascular:  Positive for palpitations.    Objective:  BP (!) 142/82   Pulse 68   Temp 97.6 F (36.4 C)   Ht 6' (1.829 m)    Wt 211 lb 6.4 oz (95.9 kg)   SpO2 97%   BMI 28.67 kg/m   BP/Weight 12/07/2020 02/09/2020 01/05/2020  Systolic BP 142 127 118  Diastolic BP 82 61 72  Wt. (Lbs) 211.4 194.4 185.8  BMI 28.67 26.74 26.28    Physical Exam Constitutional:      General: He is not in acute distress.    Appearance: Normal appearance. He is not ill-appearing.  HENT:     Head: Normocephalic and atraumatic.     Right Ear: Tympanic membrane normal.     Left Ear: Tympanic membrane normal.     Mouth/Throat:     Pharynx: Oropharynx is clear. No oropharyngeal exudate or posterior oropharyngeal erythema.  Eyes:     General:        Right eye: No discharge.        Left eye: No discharge.     Conjunctiva/sclera: Conjunctivae normal.  Cardiovascular:     Rate and Rhythm: Normal rate and regular rhythm.  Pulmonary:     Effort: Pulmonary effort is normal.     Breath sounds: Normal breath sounds. No wheezing, rhonchi or rales.  Neurological:     Mental Status: He is alert.    Lab Results  Component Value Date  WBC 8.7 09/16/2019   HGB 17.2 09/16/2019   HCT 50.1 09/16/2019   PLT 280 09/16/2019   GLUCOSE 85 09/16/2019   CHOL 147 08/10/2017   TRIG 123 (H) 08/10/2017   HDL 37 (L) 08/10/2017   LDLCALC 85 08/10/2017   ALT 23 09/16/2019   AST 23 09/16/2019   NA 138 09/16/2019   K 4.8 09/16/2019   CL 99 09/16/2019   CREATININE 0.78 09/16/2019   BUN 15 09/16/2019   CO2 24 09/16/2019   TSH 1.620 09/16/2019     Assessment & Plan:   Problem List Items Addressed This Visit       Cardiovascular and Mediastinum   White coat syndrome without diagnosis of hypertension - Primary    Blood pressures are well controlled when he is not in a clinical setting.  I have reviewed his blood pressures today.  This is consistent with whitecoat syndrome.  He does not have hypertension. I wrote a letter for him regarding his employer.  I also printed his records from cardiology.        Respiratory   Sinusitis     Likely viral with possible allergic component.  Atrovent as prescribed.  Supportive care.  If he fails improve or worsens, he may start the antibiotic that was printed (ie wait and see).      Relevant Medications   ipratropium (ATROVENT) 0.06 % nasal spray   amoxicillin-clavulanate (AUGMENTIN) 875-125 MG tablet     Other   History of palpitations   Other Visit Diagnoses     Need for vaccination       Relevant Orders   Tdap vaccine greater than or equal to 7yo IM (Completed)   Flu Vaccine QUAD 6+ mos PF IM (Fluarix Quad PF) (Completed)       Meds ordered this encounter  Medications   ipratropium (ATROVENT) 0.06 % nasal spray    Sig: Place 2 sprays into both nostrils 4 (four) times daily as needed for rhinitis.    Dispense:  15 mL    Refill:  0   amoxicillin-clavulanate (AUGMENTIN) 875-125 MG tablet    Sig: Take 1 tablet by mouth 2 (two) times daily.    Dispense:  20 tablet    Refill:  0    Follow-up:  Return in about 1 year (around 12/07/2021).  Everlene Other DO [facility]

## 2020-12-07 NOTE — Assessment & Plan Note (Signed)
Likely viral with possible allergic component.  Atrovent as prescribed.  Supportive care.  If he fails improve or worsens, he may start the antibiotic that was printed (ie wait and see).

## 2020-12-07 NOTE — Assessment & Plan Note (Addendum)
Blood pressures are well controlled when he is not in a clinical setting.  I have reviewed his blood pressures today.  This is consistent with whitecoat syndrome.  He does not have hypertension. I wrote a letter for him regarding his employer.  I also printed his records from cardiology.

## 2023-01-07 ENCOUNTER — Telehealth: Payer: Self-pay | Admitting: *Deleted

## 2023-01-07 ENCOUNTER — Encounter: Payer: Self-pay | Admitting: *Deleted

## 2023-01-07 NOTE — Telephone Encounter (Signed)
Primary Information  Source  David Estrada (Patient)   Subject  David Estrada, David Estrada (Patient)   Topic  Clinical - Medical Advice    Communication  Reason for CRM: Patient's fiance is calling because patient is experiencing mild abdomen pain, his last appointment at the office was on 12/07/2020, while scheduling it would only let me schedule the appointment as a new patient and I am aware that the practice isn't accepting new patients; however, I thought there was a 3 year rule before we can consider patient as a new patient again.     Patient Information  Patient Name Gender DOB SSN  David Estrada, David Estrada Male 2000/08/14 [id number]   Notes  Margie Ege   01/07/2023  2:21 PM  Attempted to schedule the patient as an office visit but for some reason it isn't allowing me to. It only gives me the option to schedule as a new patient.     Margaretha Sheffield, RN   01/07/2023 11:19 AM  Patient has established care with the new provider(10/22) Dr Adriana Simas and within 3 years can still be seen by him     Contacts  Contact Date/Time Type Contact Phone/Fax  01/07/2023 11:07 AM EST Phone (Incoming) David Estrada, David Estrada (Self)   01/07/2023 11:10 AM EST Phone (Incoming) [place] (Emergency Contact) [phone] (M)   Routing History   From To Priority  01/07/2023 02:34 PM Orma Render [facility] Routine  01/07/2023 02:21 PM Yoc, Gillie Manners [facility] Margie Ege Routine  01/07/2023 11:19 AM Margaretha Sheffield, RN Margie Ege Routine  01/07/2023 11:10 AM Rexene Edison Gillie Manners [facility] Routine

## 2023-01-07 NOTE — Telephone Encounter (Signed)
Patient notified of appointment via My chart

## 2023-01-07 NOTE — Telephone Encounter (Signed)
Patient scheduled with Dr Adriana Simas 01/09/23 at 1:50PM  Left message to return call to notify of appointment.

## 2023-01-09 ENCOUNTER — Encounter: Payer: Self-pay | Admitting: Family Medicine

## 2023-01-09 ENCOUNTER — Ambulatory Visit: Payer: BC Managed Care – PPO | Admitting: Family Medicine

## 2023-01-09 VITALS — BP 133/84 | HR 71 | Temp 98.2°F | Ht 72.0 in | Wt 224.0 lb

## 2023-01-09 DIAGNOSIS — R1012 Left upper quadrant pain: Secondary | ICD-10-CM | POA: Insufficient documentation

## 2023-01-09 DIAGNOSIS — R1032 Left lower quadrant pain: Secondary | ICD-10-CM | POA: Insufficient documentation

## 2023-01-09 MED ORDER — PANTOPRAZOLE SODIUM 40 MG PO TBEC: 40.0000 mg | DELAYED_RELEASE_TABLET | Freq: Every day | ORAL | 0 refills | Status: DC

## 2023-01-09 NOTE — Assessment & Plan Note (Signed)
No tenderness on exam.  He is well-appearing.   Discussed watchful waiting versus trial of PPI.  Going ahead with trial of PPI.

## 2023-01-09 NOTE — Patient Instructions (Signed)
Medication as prescribed.  If continues to persist, please let me know.  Take care  Dr. Adriana Simas

## 2023-01-09 NOTE — Progress Notes (Signed)
Subjective:  Patient ID: David Estrada, male    DOB: 2000-09-22  Age: 22 y.o. MRN: [id number]  CC:   Chief Complaint  Patient presents with   left upper abd pain     1 week plays pickle ball and exercises    HPI:  22 year old male presents for evaluation of the above.  Patient reports 1 week history of intermittent left upper quadrant pain.  Last for seconds when it occurs.  Resolved spontaneously.  No associated nausea.  Normal appetite.  He does report that he has had some belching and heartburn.  No other GI issues.  No recent fall, trauma, injury.  No other associated symptoms.  No other complaints.  Patient Active Problem List   Diagnosis Date Noted   LLQ abdominal pain 01/09/2023   History of palpitations 12/07/2020   White coat syndrome without diagnosis of hypertension 12/07/2020    Social Hx   Social History   Socioeconomic History   Marital status: Single    Spouse name: Not on file   Number of children: Not on file   Years of education: Not on file   Highest education level: Not on file  Occupational History   Not on file  Tobacco Use   Smoking status: Never   Smokeless tobacco: Never  Substance and Sexual Activity   Alcohol use: Never   Drug use: Never   Sexual activity: Not on file  Other Topics Concern   Not on file  Social History Narrative   Not on file   Social Determinants of Health   Financial Resource Strain: Not on file  Food Insecurity: Not on file  Transportation Needs: Not on file  Physical Activity: Not on file  Stress: Not on file  Social Connections: Not on file    Review of Systems Per HPI  Objective:  BP 133/84   Pulse 71   Temp 98.2 F (36.8 C)   Ht 6' (1.829 m)   Wt 224 lb (101.6 kg)   SpO2 99%   BMI 30.38 kg/m      01/09/2023   11:04 AM 12/07/2020   10:12 AM 02/09/2020   10:06 AM  BP/Weight  Systolic BP 133 142 127  Diastolic BP 84 82 61  Wt. (Lbs) 224 211.4 194.4  BMI 30.38 kg/m2 28.67 kg/m2 26.74 kg/m2     Physical Exam Vitals and nursing note reviewed.  Constitutional:      General: He is not in acute distress.    Appearance: Normal appearance.  HENT:     Head: Normocephalic and atraumatic.  Cardiovascular:     Rate and Rhythm: Normal rate and regular rhythm.  Pulmonary:     Effort: Pulmonary effort is normal.     Breath sounds: Normal breath sounds.  Abdominal:     General: There is no distension.     Palpations: Abdomen is soft.     Tenderness: There is no abdominal tenderness.  Neurological:     Mental Status: He is alert.     Lab Results  Component Value Date   WBC 8.7 09/16/2019   HGB 17.2 09/16/2019   HCT 50.1 09/16/2019   PLT 280 09/16/2019   GLUCOSE 85 09/16/2019   CHOL 147 08/10/2017   TRIG 123 (H) 08/10/2017   HDL 37 (L) 08/10/2017   LDLCALC 85 08/10/2017   ALT 23 09/16/2019   AST 23 09/16/2019   NA 138 09/16/2019   K 4.8 09/16/2019   CL 99 09/16/2019  CREATININE 0.78 09/16/2019   BUN 15 09/16/2019   CO2 24 09/16/2019   TSH 1.620 09/16/2019     Assessment & Plan:   Problem List Items Addressed This Visit       Other   LLQ abdominal pain - Primary    No tenderness on exam.  He is well-appearing.   Discussed watchful waiting versus trial of PPI.  Going ahead with trial of PPI.       Meds ordered this encounter  Medications   pantoprazole (PROTONIX) 40 MG tablet    Sig: Take 1 tablet (40 mg total) by mouth daily.    Dispense:  30 tablet    Refill:  0    Follow-up:  Return if symptoms worsen or fail to improve.  Everlene Other DO [facility]

## 2023-10-21 ENCOUNTER — Emergency Department (HOSPITAL_COMMUNITY)

## 2023-10-21 ENCOUNTER — Encounter (HOSPITAL_COMMUNITY)
Admission: EM | Disposition: A | Discharge status: Home / Self Care | Payer: Self-pay | Source: Home / Self Care | Attending: Emergency Medicine

## 2023-10-21 ENCOUNTER — Emergency Department (HOSPITAL_COMMUNITY): Admitting: Anesthesiology

## 2023-10-21 ENCOUNTER — Other Ambulatory Visit: Payer: Self-pay

## 2023-10-21 ENCOUNTER — Observation Stay (HOSPITAL_COMMUNITY)
Admission: EM | Admit: 2023-10-21 | Discharge: 2023-10-22 | Disposition: A | Discharge status: Home / Self Care | Attending: Orthopedic Surgery | Admitting: Orthopedic Surgery

## 2023-10-21 ENCOUNTER — Encounter (HOSPITAL_COMMUNITY): Payer: Self-pay

## 2023-10-21 DIAGNOSIS — S82831A Other fracture of upper and lower end of right fibula, initial encounter for closed fracture: Secondary | ICD-10-CM | POA: Diagnosis present

## 2023-10-21 DIAGNOSIS — G5602 Carpal tunnel syndrome, left upper limb: Secondary | ICD-10-CM | POA: Insufficient documentation

## 2023-10-21 DIAGNOSIS — S52592A Other fractures of lower end of left radius, initial encounter for closed fracture: Secondary | ICD-10-CM | POA: Diagnosis not present

## 2023-10-21 DIAGNOSIS — S52502A Unspecified fracture of the lower end of left radius, initial encounter for closed fracture: Principal | ICD-10-CM | POA: Insufficient documentation

## 2023-10-21 DIAGNOSIS — S6412XA Injury of median nerve at wrist and hand level of left arm, initial encounter: Secondary | ICD-10-CM | POA: Insufficient documentation

## 2023-10-21 DIAGNOSIS — I10 Essential (primary) hypertension: Secondary | ICD-10-CM | POA: Insufficient documentation

## 2023-10-21 HISTORY — PX: EXTERNAL FIXATION ARM: SHX1552

## 2023-10-21 HISTORY — PX: CARPAL TUNNEL RELEASE: SHX101

## 2023-10-21 SURGERY — CARPAL TUNNEL RELEASE
Anesthesia: General | Site: Wrist | Laterality: Left

## 2023-10-21 MED ORDER — FENTANYL CITRATE (PF) 100 MCG/2ML IJ SOLN
INTRAMUSCULAR | Status: AC
  Filled 2023-10-21: qty 2

## 2023-10-21 MED ORDER — PROPOFOL 10 MG/ML IV BOLUS
INTRAVENOUS | Status: AC
  Filled 2023-10-21: qty 20

## 2023-10-21 MED ORDER — CHLORHEXIDINE GLUCONATE 4 % EX SOLN
60.0000 mL | Freq: Once | CUTANEOUS | Status: DC
Start: 2023-10-21 — End: 2023-10-22

## 2023-10-21 MED ORDER — PROPOFOL 10 MG/ML IV BOLUS
INTRAVENOUS | Status: DC | PRN
  Administered 2023-10-21: 50.8 mg via INTRAVENOUS

## 2023-10-21 MED ORDER — ROCURONIUM BROMIDE 100 MG/10ML IV SOLN
INTRAVENOUS | Status: DC | PRN
  Administered 2023-10-21: 20 mg via INTRAVENOUS

## 2023-10-21 MED ORDER — PROPOFOL 10 MG/ML IV BOLUS
INTRAVENOUS | Status: AC
Start: 2023-10-21 — End: 2023-10-21
  Filled 2023-10-21: qty 20

## 2023-10-21 MED ORDER — OXYCODONE HCL 5 MG/5ML PO SOLN: 5.0000 mg | Freq: Once | ORAL | Status: DC | PRN

## 2023-10-21 MED ORDER — SUGAMMADEX SODIUM 200 MG/2ML IV SOLN
INTRAVENOUS | Status: AC
  Filled 2023-10-21: qty 2

## 2023-10-21 MED ORDER — BUPIVACAINE HCL (PF) 0.5 % IJ SOLN
INTRAMUSCULAR | Status: DC | PRN
  Administered 2023-10-21: 10 mL

## 2023-10-21 MED ORDER — LACTATED RINGERS IV SOLN
INTRAVENOUS | Status: DC | PRN
Start: 2023-10-21 — End: 2023-10-21

## 2023-10-21 MED ORDER — OXYCODONE HCL 5 MG PO TABS: 5.0000 mg | ORAL_TABLET | Freq: Once | ORAL | Status: DC | PRN

## 2023-10-21 MED ORDER — PHENYLEPHRINE 80 MCG/ML (10ML) SYRINGE FOR IV PUSH (FOR BLOOD PRESSURE SUPPORT)
PREFILLED_SYRINGE | INTRAVENOUS | Status: AC
  Filled 2023-10-21: qty 10

## 2023-10-21 MED ORDER — ONDANSETRON HCL 4 MG/2ML IJ SOLN
INTRAMUSCULAR | Status: DC | PRN
  Administered 2023-10-21: 4 mg via INTRAVENOUS

## 2023-10-21 MED ORDER — ONDANSETRON HCL 4 MG/2ML IJ SOLN
INTRAMUSCULAR | Status: AC
Start: 2023-10-21 — End: 2023-10-21
  Filled 2023-10-21: qty 2

## 2023-10-21 MED ORDER — MIDAZOLAM HCL 5 MG/5ML IJ SOLN
INTRAMUSCULAR | Status: DC | PRN
  Administered 2023-10-21: 2 mg via INTRAVENOUS

## 2023-10-21 MED ORDER — STERILE WATER FOR IRRIGATION IR SOLN
Status: DC | PRN
Start: 2023-10-21 — End: 2023-10-21
  Administered 2023-10-21: 1000 mL

## 2023-10-21 MED ORDER — BUPIVACAINE HCL (PF) 0.5 % IJ SOLN
INTRAMUSCULAR | Status: AC
  Filled 2023-10-21: qty 30

## 2023-10-21 MED ORDER — OXYCODONE HCL 5 MG PO TABS
5.0000 mg | ORAL_TABLET | Freq: Once | ORAL | Status: AC
  Administered 2023-10-21: 5 mg via ORAL
  Filled 2023-10-21: qty 1

## 2023-10-21 MED ORDER — FENTANYL CITRATE (PF) 100 MCG/2ML IJ SOLN
INTRAMUSCULAR | Status: DC | PRN
  Administered 2023-10-21: 50 ug via INTRAVENOUS
  Administered 2023-10-21 (×2): 25 ug via INTRAVENOUS
  Administered 2023-10-21 (×2): 100 ug via INTRAVENOUS

## 2023-10-21 MED ORDER — DEXAMETHASONE SODIUM PHOSPHATE 10 MG/ML IJ SOLN
INTRAMUSCULAR | Status: AC
  Filled 2023-10-21: qty 1

## 2023-10-21 MED ORDER — ORAL CARE MOUTH RINSE: 15.0000 mL | Freq: Once | OROMUCOSAL | Status: DC

## 2023-10-21 MED ORDER — KETAMINE HCL 50 MG/5ML IJ SOSY: 0.5000 mg/kg | PREFILLED_SYRINGE | Freq: Once | INTRAMUSCULAR | Status: DC

## 2023-10-21 MED ORDER — SUGAMMADEX SODIUM 200 MG/2ML IV SOLN
INTRAVENOUS | Status: DC | PRN
  Administered 2023-10-21: 200 mg via INTRAVENOUS

## 2023-10-21 MED ORDER — HYDROMORPHONE HCL 1 MG/ML IJ SOLN: 0.2500 mg | INTRAMUSCULAR | Status: DC | PRN

## 2023-10-21 MED ORDER — CEFAZOLIN SODIUM-DEXTROSE 2-4 GM/100ML-% IV SOLN
2.0000 g | INTRAVENOUS | Status: DC
Start: 2023-10-22 — End: 2023-10-23

## 2023-10-21 MED ORDER — KETAMINE HCL 50 MG/5ML IJ SOSY
PREFILLED_SYRINGE | INTRAMUSCULAR | Status: AC
  Administered 2023-10-21: 51 mg via INTRAVENOUS
  Filled 2023-10-21: qty 5

## 2023-10-21 MED ORDER — LIDOCAINE HCL (PF) 1 % IJ SOLN
10.0000 mL | Freq: Once | INTRAMUSCULAR | Status: AC
  Administered 2023-10-21: 10 mL
  Filled 2023-10-21: qty 10

## 2023-10-21 MED ORDER — MIDAZOLAM HCL 2 MG/2ML IJ SOLN
INTRAMUSCULAR | Status: AC
  Filled 2023-10-21: qty 2

## 2023-10-21 MED ORDER — LACTATED RINGERS IV SOLN: INTRAVENOUS | Status: DC

## 2023-10-21 MED ORDER — POVIDONE-IODINE 10 % EX SWAB
2.0000 | Freq: Once | CUTANEOUS | Status: DC
Start: 2023-10-21 — End: 2023-10-22

## 2023-10-21 MED ORDER — PHENYLEPHRINE HCL (PRESSORS) 10 MG/ML IV SOLN
INTRAVENOUS | Status: DC | PRN
Start: 2023-10-21 — End: 2023-10-21
  Administered 2023-10-21 (×6): 80 ug via INTRAVENOUS

## 2023-10-21 MED ORDER — LIDOCAINE HCL (CARDIAC) PF 100 MG/5ML IV SOSY
PREFILLED_SYRINGE | INTRAVENOUS | Status: DC | PRN
  Administered 2023-10-21: 100 mg via INTRAVENOUS

## 2023-10-21 MED ORDER — 0.9 % SODIUM CHLORIDE (POUR BTL) OPTIME
TOPICAL | Status: DC | PRN
Start: 2023-10-21 — End: 2023-10-21
  Administered 2023-10-21: 1000 mL

## 2023-10-21 MED ORDER — BUPIVACAINE HCL (PF) 0.5 % IJ SOLN
10.0000 mL | Freq: Once | INTRAMUSCULAR | Status: AC
  Administered 2023-10-21: 10 mL
  Filled 2023-10-21: qty 30

## 2023-10-21 MED ORDER — SODIUM CHLORIDE 0.9 % IV SOLN: 12.5000 mg | INTRAVENOUS | Status: DC | PRN

## 2023-10-21 MED ORDER — KETAMINE HCL 50 MG/5ML IJ SOSY: 0.5000 mg/kg | PREFILLED_SYRINGE | Freq: Once | INTRAMUSCULAR | Status: AC

## 2023-10-21 MED ORDER — CHLORHEXIDINE GLUCONATE 0.12 % MT SOLN: 15.0000 mL | Freq: Once | OROMUCOSAL | Status: DC

## 2023-10-21 MED ORDER — FENTANYL CITRATE (PF) 100 MCG/2ML IJ SOLN
50.0000 ug | Freq: Once | INTRAMUSCULAR | Status: AC
  Administered 2023-10-21: 50 ug via INTRAVENOUS
  Filled 2023-10-21: qty 2

## 2023-10-21 MED ORDER — KETOROLAC TROMETHAMINE 30 MG/ML IJ SOLN
INTRAMUSCULAR | Status: DC | PRN
  Administered 2023-10-21: 30 mg via INTRAVENOUS

## 2023-10-21 MED ORDER — KETOROLAC TROMETHAMINE 30 MG/ML IJ SOLN
INTRAMUSCULAR | Status: AC
  Filled 2023-10-21: qty 1

## 2023-10-21 MED ORDER — PROPOFOL 10 MG/ML IV BOLUS
INTRAVENOUS | Status: DC | PRN
  Administered 2023-10-21: 200 mg via INTRAVENOUS

## 2023-10-21 MED ORDER — HYDROMORPHONE HCL 1 MG/ML IJ SOLN
1.0000 mg | Freq: Once | INTRAMUSCULAR | Status: AC
  Administered 2023-10-21: 1 mg via INTRAVENOUS
  Filled 2023-10-21: qty 1

## 2023-10-21 MED ORDER — SUCCINYLCHOLINE CHLORIDE 20 MG/ML IJ SOLN
INTRAMUSCULAR | Status: DC | PRN
  Administered 2023-10-21: 140 mg via INTRAVENOUS

## 2023-10-21 MED ORDER — KETAMINE HCL 50 MG/5ML IJ SOSY
PREFILLED_SYRINGE | INTRAMUSCULAR | Status: AC
  Filled 2023-10-21: qty 5

## 2023-10-21 MED ORDER — LIDOCAINE 2% (20 MG/ML) 5 ML SYRINGE
INTRAMUSCULAR | Status: AC
  Filled 2023-10-21: qty 5

## 2023-10-21 MED ORDER — PROPOFOL 10 MG/ML IV BOLUS
0.5000 mg/kg | Freq: Once | INTRAVENOUS | Status: AC
  Administered 2023-10-21: 50.8 mg via INTRAVENOUS
  Filled 2023-10-21: qty 20

## 2023-10-21 MED ORDER — SUCCINYLCHOLINE CHLORIDE 200 MG/10ML IV SOSY
PREFILLED_SYRINGE | INTRAVENOUS | Status: AC
  Filled 2023-10-21: qty 10

## 2023-10-21 SURGICAL SUPPLY — 39 items
BANDAGE ESMARK 4X12 BL STRL LF (DISPOSABLE) ×2 IMPLANT
BLADE SURG 15 STRL LF DISP TIS (BLADE) ×2 IMPLANT
BNDG COHESIVE 4X5 TAN STRL LF (GAUZE/BANDAGES/DRESSINGS) IMPLANT
BNDG ELASTIC 4X5.8 VLCR NS LF (GAUZE/BANDAGES/DRESSINGS) IMPLANT
BNDG GAUZE ELAST 4 BULKY (GAUZE/BANDAGES/DRESSINGS) ×2 IMPLANT
CHLORAPREP W/TINT 26 (MISCELLANEOUS) ×2 IMPLANT
CLOTH BEACON ORANGE TIMEOUT ST (SAFETY) ×2 IMPLANT
COVER LIGHT HANDLE (MISCELLANEOUS) IMPLANT
CUFF TOURN SGL QUICK 18X4 (TOURNIQUET CUFF) ×2 IMPLANT
DRAPE C-ARM FOLDED MOBILE STRL (DRAPES) ×2 IMPLANT
DRAPE HALF SHEET 40X57 (DRAPES) IMPLANT
ELECT NDL TIP 2.8 STRL (NEEDLE) IMPLANT
ELECT NEEDLE TIP 2.8 STRL (NEEDLE) ×2 IMPLANT
ELECTRODE REM PT RTRN 9FT ADLT (ELECTROSURGICAL) ×2 IMPLANT
GAUZE SPONGE 4X4 12PLY STRL (GAUZE/BANDAGES/DRESSINGS) ×2 IMPLANT
GAUZE XEROFORM 1X8 LF (GAUZE/BANDAGES/DRESSINGS) ×2 IMPLANT
GLOVE BIOGEL PI IND STRL 7.0 (GLOVE) ×4 IMPLANT
GLOVE BIOGEL PI IND STRL 8.5 (GLOVE) ×4 IMPLANT
GLOVE SKINSENSE STRL SZ8.0 LF (GLOVE) ×2 IMPLANT
GOWN STRL REUS W/TWL LRG LVL3 (GOWN DISPOSABLE) ×2 IMPLANT
GOWN STRL REUS W/TWL XL LVL3 (GOWN DISPOSABLE) ×2 IMPLANT
KIT TURNOVER KIT A (KITS) ×2 IMPLANT
KWIRE DBL END TROCAR 6X.062 (WIRE) IMPLANT
MANIFOLD NEPTUNE II (INSTRUMENTS) ×2 IMPLANT
NDL HYPO 21X1.5 SAFETY (NEEDLE) ×2 IMPLANT
NEEDLE HYPO 21X1.5 SAFETY (NEEDLE) ×2 IMPLANT
NS IRRIG 1000ML POUR BTL (IV SOLUTION) ×2 IMPLANT
PACK BASIC LIMB (CUSTOM PROCEDURE TRAY) ×2 IMPLANT
PAD ABD 5X9 TENDERSORB (GAUZE/BANDAGES/DRESSINGS) IMPLANT
PAD ARMBOARD POSITIONER FOAM (MISCELLANEOUS) ×4 IMPLANT
PAD CAST 4YDX4 CTTN HI CHSV (CAST SUPPLIES) ×2 IMPLANT
PIN GUARD 1.57MM GREEN STERILE (PIN) IMPLANT
POSITIONER HAND ALUMI XLG (MISCELLANEOUS) ×2 IMPLANT
SET BASIN LINEN APH (SET/KITS/TRAYS/PACK) ×2 IMPLANT
SPLINT FIBERGLASS 3X35 (CAST SUPPLIES) IMPLANT
SUT 3-0 BLK 1X30 PSL (SUTURE) ×2 IMPLANT
SUT MON AB 2-0 SH27 (SUTURE) IMPLANT
SYR BULB IRRIG 60ML STRL (SYRINGE) ×4 IMPLANT
SYR CONTROL 10ML LL (SYRINGE) ×2 IMPLANT

## 2023-10-21 NOTE — ED Notes (Signed)
 ED Provider at bedside.

## 2023-10-21 NOTE — Transfer of Care (Signed)
 Immediate Anesthesia Transfer of Care Note  Patient: David Estrada  Procedure(s) Performed: CARPAL TUNNEL RELEASE (Left: Hand) OPEN REDUCTION PERCUTANEOUS FIXATION, LEFT WRIST (Left: Wrist)  Patient Location: PACU  Anesthesia Type:General  Level of Consciousness: sedated  Airway & Oxygen Therapy: Patient Spontanous Breathing  Post-op Assessment: Post -op Vital signs reviewed and stable  Post vital signs: stable  Last Vitals:  Vitals Value Taken Time  BP 125/55 10/21/23 23:42  Temp    Pulse 100 10/21/23 23:45  Resp 19 10/21/23 23:45  SpO2 92 % 10/21/23 23:45  Vitals shown include unfiled device data.  Last Pain:  Vitals:   10/21/23 1933  TempSrc:   PainSc: 8          Complications: There were no known notable events for this encounter.

## 2023-10-21 NOTE — Brief Op Note (Signed)
 10/21/2023   11:41 PM   PATIENT:  David Estrada  23 y.o. male   PRE-OPERATIVE DIAGNOSIS: Left wrist: Median nerve entrapment and fracture radius and ulna with dorsal displacement   POST-OPERATIVE DIAGNOSIS:  median nerve entrapment and distal radius and ulnar fracture with dorsal displacement   FINDINGS: #1 the median nerve was compressed by the proximal fragment of the distal radius, the fracture fragment was tenting the nerve   #2 comminuted fracture intra-articular dorsal subluxation of the entire carpus with instability of the distal radius and ulna   PROCEDURE:   OPEN REDUCTION PERCUTANEOUS FIXATION, LEFT WRIST    CARPAL TUNNEL RELEASE left wrist   Implant one 1.6 K wire into the radius   SURGEON:  Surgeons and Role: Panel 1:    DEWAINE Margrette Taft FORBES, MD - Primary Panel 2:    * Margrette Taft FORBES, MD - Primary   PHYSICIAN ASSISTANT: No physician assistant   ASSISTANTS: none    ANESTHESIA:   general   Details of procedure Appropriate preop evaluation was performed in the emergency room   Patient was brought to the operating room had general anesthesia with maximum sedation and relaxation to assist in fracture reduction   Timeout was taken and the C-arm was brought in and a straight traction radiograph was obtained with improved alignment on the AP but not on the lateral.  Attempted exacerbation of deformity and traction and reduction and reversal of deformity slightly improved the alignment but the fracture was still unstable at subluxated back into its position of displacement   We then prepped and draped sterilely.  We repeated the timeout.  I did a regular carpal tunnel release and extended it into the distal forearm with a zigzag incision.  After exsanguination of the limb and elevation of the tourniquet we made the incision in the hand and palm in line with the radial border of the ring finger.  However the wrist was so subluxated and displaced that the distal aspect  of the carpal tunnel months found there was ulnar artery exposed.  This was carefully retracted.  Further dissection and careful dissection allowed blunt dissection beneath the carpal tunnel transverse carpal ligament and this was released and this was carried through the distal flexor crease and the nerve was identified with the fracture attending it volarly   At this point since the fracture fragment was visibly in the wound I took a blunt elevator and placed it into the fracture and joystick the fracture and leave it distally obtaining a improved reduction.   This was held with a 1.6 K wire.   The wound was irrigated and closed with 2-0 Monocryl suture and 3-0 nylon suture in interrupted fashion.  The pin was bent and cut and capped.   Final imaging was performed including oblique views and lunate fossa views   The Marcaine  was placed dorsally at the fracture site.   I did not see any tendon laceration although we did see some muscle damage in the flexor tendons.  However on passive manipulation they all were mobile intact with normal cascade of the fingers.         EBL:  25 mL    BLOOD ADMINISTERED:none   DRAINS: none    LOCAL MEDICATIONS USED:  MARCAINE       SPECIMEN:  No Specimen   DISPOSITION OF SPECIMEN:  N/A   COUNTS:  YES   TOURNIQUET:   Total Tourniquet Time Documented: Upper Arm (Left) - 50 minutes  Total: Upper Arm (Left) - 50 minutes     DICTATION: .Nechama Dictation   PLAN OF CARE: Admit for overnight observation   PATIENT DISPOSITION:  PACU - hemodynamically stable.   Delay start of Pharmacological VTE agent (>24hrs) due to surgical blood loss or risk of bleeding: not applicable
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
## 2023-10-21 NOTE — ED Notes (Signed)
 MD present during triage

## 2023-10-21 NOTE — ED Notes (Signed)
 Portable Xray at bedside.

## 2023-10-21 NOTE — Brief Op Note (Signed)
 10/21/2023  11:48 PM  PATIENT:  David Estrada  23 y.o. male  PRE-OPERATIVE DIAGNOSIS:  median nerve entrapment  POST-OPERATIVE DIAGNOSIS:  median nerve entrapment and distal radius and ulnar fracture  PROCEDURE:  Procedure(s) with comments: CARPAL TUNNEL RELEASE (Left) - maximum relaxation OPEN REDUCTION PERCUTANEOUS FIXATION, LEFT WRIST (Left) - k wires,  c arm  SURGEON:  Surgeons and Role: Panel 1:    DEWAINE Margrette Taft FORBES, MD - Primary Panel 2:    * Margrette Taft FORBES, MD - Primary  PHYSICIAN ASSISTANT:   Total Tourniquet Time Documented: Upper Arm (Left) - 50 minutes Total: Upper Arm (Left) - 50 minutes  The patient returned to PACU in stable condition   Delay start of Pharmacological VTE agent (>24hrs) due to surgical blood loss or risk of bleeding: not applicable

## 2023-10-21 NOTE — Anesthesia Preprocedure Evaluation (Signed)
 Anesthesia Evaluation  Patient identified by MRN, date of birth, ID band Patient awake    Reviewed: Allergy & Precautions, H&P , NPO status , Patient's Chart, lab work & pertinent test results, reviewed documented beta blocker date and time   Airway Mallampati: II  TM Distance: >3 FB Neck ROM: full    Dental no notable dental hx. (+) Dental Advisory Given, Teeth Intact   Pulmonary neg pulmonary ROS   Pulmonary exam normal breath sounds clear to auscultation       Cardiovascular Exercise Tolerance: Good hypertension, Normal cardiovascular exam Rhythm:regular Rate:Normal  palpitations   Neuro/Psych negative neurological ROS  negative psych ROS   GI/Hepatic negative GI ROS, Neg liver ROS,,,  Endo/Other  negative endocrine ROS    Renal/GU negative Renal ROS  negative genitourinary   Musculoskeletal   Abdominal   Peds  Hematology negative hematology ROS (+)   Anesthesia Other Findings   Reproductive/Obstetrics negative OB ROS                              Anesthesia Physical Anesthesia Plan  ASA: 2 and emergent  Anesthesia Plan: General   Post-op Pain Management: Dilaudid  IV   Induction: Rapid sequence and Cricoid pressure planned  PONV Risk Score and Plan: Ondansetron , Dexamethasone  and Midazolam   Airway Management Planned: Oral ETT  Additional Equipment: None  Intra-op Plan:   Post-operative Plan: Extubation in OR  Informed Consent: I have reviewed the patients History and Physical, chart, labs and discussed the procedure including the risks, benefits and alternatives for the proposed anesthesia with the patient or authorized representative who has indicated his/her understanding and acceptance.     Dental Advisory Given  Plan Discussed with: CRNA  Anesthesia Plan Comments:          Anesthesia Quick Evaluation

## 2023-10-22 ENCOUNTER — Observation Stay (HOSPITAL_COMMUNITY)

## 2023-10-22 ENCOUNTER — Encounter (HOSPITAL_COMMUNITY): Payer: Self-pay | Admitting: Orthopedic Surgery

## 2023-10-22 DIAGNOSIS — S52509A Unspecified fracture of the lower end of unspecified radius, initial encounter for closed fracture: Secondary | ICD-10-CM | POA: Insufficient documentation

## 2023-10-22 MED ORDER — ACETAMINOPHEN 325 MG PO TABS: 325.0000 mg | ORAL_TABLET | Freq: Four times a day (QID) | ORAL | Status: DC | PRN

## 2023-10-22 MED ORDER — ACETAMINOPHEN 500 MG PO TABS
1000.0000 mg | ORAL_TABLET | Freq: Four times a day (QID) | ORAL | Status: DC
  Administered 2023-10-22 (×2): 1000 mg via ORAL
  Filled 2023-10-22 (×2): qty 2

## 2023-10-22 MED ORDER — ONDANSETRON HCL 4 MG PO TABS: 4.0000 mg | ORAL_TABLET | Freq: Four times a day (QID) | ORAL | Status: DC | PRN

## 2023-10-22 MED ORDER — OXYCODONE-ACETAMINOPHEN 5-325 MG PO TABS: 1.0000 | ORAL_TABLET | ORAL | 0 refills | Status: DC | PRN

## 2023-10-22 MED ORDER — OXYCODONE HCL 5 MG PO TABS
5.0000 mg | ORAL_TABLET | ORAL | Status: DC | PRN
  Administered 2023-10-22: 5 mg via ORAL
  Filled 2023-10-22: qty 1

## 2023-10-22 MED ORDER — DOCUSATE SODIUM 100 MG PO CAPS
100.0000 mg | ORAL_CAPSULE | Freq: Two times a day (BID) | ORAL | Status: DC
  Administered 2023-10-22 (×2): 100 mg via ORAL
  Filled 2023-10-22 (×2): qty 1

## 2023-10-22 MED ORDER — TRAMADOL HCL 50 MG PO TABS
50.0000 mg | ORAL_TABLET | Freq: Four times a day (QID) | ORAL | Status: DC
  Administered 2023-10-22 (×2): 50 mg via ORAL
  Filled 2023-10-22 (×2): qty 1

## 2023-10-22 MED ORDER — OXYCODONE HCL 5 MG PO TABS: 10.0000 mg | ORAL_TABLET | ORAL | Status: DC | PRN

## 2023-10-22 MED ORDER — SODIUM CHLORIDE 0.9 % IV SOLN: INTRAVENOUS | Status: DC

## 2023-10-22 MED ORDER — HYDROMORPHONE HCL 1 MG/ML IJ SOLN: 0.5000 mg | INTRAMUSCULAR | Status: DC | PRN

## 2023-10-22 MED ORDER — ONDANSETRON HCL 4 MG/2ML IJ SOLN: 4.0000 mg | Freq: Four times a day (QID) | INTRAMUSCULAR | Status: DC | PRN

## 2023-10-22 NOTE — TOC CM/SW Note (Signed)
 Transition of Care [facility]) - Inpatient Brief Assessment   Patient Details  Name: David Estrada MRN: [id number] Date of Birth: 03/14/2000  Transition of Care [facility] CM/SW Contact:    Noreen KATHEE Pinal, LCSWA Phone Number: 10/22/2023, 11:03 AM   Clinical Narrative:  Transition of Care Department [facility]) has reviewed patient and no TOC needs have been identified at this time. We will continue to monitor patient advancement through interdisciplinary progression rounds. If new patient transition needs arise, please place a TOC consult.   Transition of Care Asessment: Insurance and Status: Insurance coverage has been reviewed Patient has primary care physician: Yes Home environment has been reviewed: Single Family Home Prior level of function:: Independent Prior/Current Home Services: No current home services Social Drivers of Health Review: SDOH reviewed no interventions necessary Readmission risk has been reviewed: Yes Transition of care needs: no transition of care needs at this time

## 2023-10-22 NOTE — Progress Notes (Signed)
 Discharge teaching complete. Meds, diet, activity, follow up appointments, incision care reviewed and all questions answered. Copy of instructions given to patient and prescription sent to pharmacy.

## 2023-10-22 NOTE — Op Note (Signed)
 10/21/2023   11:41 PM   PATIENT:  David Estrada  23 y.o. male   PRE-OPERATIVE DIAGNOSIS: Left wrist: Median nerve entrapment and fracture radius and ulna with dorsal displacement   POST-OPERATIVE DIAGNOSIS:  median nerve entrapment and distal radius and ulnar fracture with dorsal displacement   FINDINGS: #1 the median nerve was compressed by the proximal fragment of the distal radius, the fracture fragment was tenting the nerve   #2 comminuted fracture intra-articular dorsal subluxation of the entire carpus with instability of the distal radius and ulna   PROCEDURE:   OPEN REDUCTION PERCUTANEOUS FIXATION, LEFT WRIST    CARPAL TUNNEL RELEASE left wrist   Implant one 1.6 K wire into the radius   SURGEON:  Surgeons and Role: Panel 1:    DEWAINE Margrette Taft FORBES, MD - Primary Panel 2:    * Margrette Taft FORBES, MD - Primary   PHYSICIAN ASSISTANT: No physician assistant   ASSISTANTS: none    ANESTHESIA:   general   Details of procedure Appropriate preop evaluation was performed in the emergency room   Patient was brought to the operating room had general anesthesia with maximum sedation and relaxation to assist in fracture reduction   Timeout was taken and the C-arm was brought in and a straight traction radiograph was obtained with improved alignment on the AP but not on the lateral.  Attempted exacerbation of deformity and traction and reduction and reversal of deformity slightly improved the alignment but the fracture was still unstable at subluxated back into its position of displacement   We then prepped and draped sterilely.  We repeated the timeout.  I did a regular carpal tunnel release and extended it into the distal forearm with a zigzag incision.  After exsanguination of the limb and elevation of the tourniquet we made the incision in the hand and palm in line with the radial border of the ring finger.  However the wrist was so subluxated and displaced that the distal aspect  of the carpal tunnel months found there was ulnar artery exposed.  This was carefully retracted.  Further dissection and careful dissection allowed blunt dissection beneath the carpal tunnel transverse carpal ligament and this was released and this was carried through the distal flexor crease and the nerve was identified with the fracture attending it volarly   At this point since the fracture fragment was visibly in the wound I took a blunt elevator and placed it into the fracture and joystick the fracture and leave it distally obtaining a improved reduction.   This was held with a 1.6 K wire.   The wound was irrigated and closed with 2-0 Monocryl suture and 3-0 nylon suture in interrupted fashion.  The pin was bent and cut and capped.   Final imaging was performed including oblique views and lunate fossa views   The Marcaine  was placed dorsally at the fracture site.   I did not see any tendon laceration although we did see some muscle damage in the flexor tendons.  However on passive manipulation they all were mobile intact with normal cascade of the fingers.         EBL:  25 mL    BLOOD ADMINISTERED:none   DRAINS: none    LOCAL MEDICATIONS USED:  MARCAINE       SPECIMEN:  No Specimen   DISPOSITION OF SPECIMEN:  N/A   COUNTS:  YES   TOURNIQUET:   Total Tourniquet Time Documented: Upper Arm (Left) - 50 minutes  Total: Upper Arm (Left) - 50 minutes     DICTATION: .Nechama Dictation   PLAN OF CARE: Admit for overnight observation   PATIENT DISPOSITION:  PACU - hemodynamically stable.   Delay start of Pharmacological VTE agent (>24hrs) due to surgical blood loss or risk of bleeding: not applicable

## 2023-10-22 NOTE — Progress Notes (Signed)
 Subjective: 1 Day Post-Op Procedure(s) (LRB): CARPAL TUNNEL RELEASE (Left) OPEN REDUCTION PERCUTANEOUS FIXATION, LEFT WRIST (Left) Patient reports pain as moderate.    Objective: Vital signs in last 24 hours: Temp:  [98.4 F (36.9 C)-99.6 F (37.6 C)] 98.4 F (36.9 C) (09/04 0500) Pulse Rate:  [64-123] 110 (09/04 0500) Resp:  [13-21] 20 (09/04 0500) BP: (125-185)/(54-105) 173/84 (09/04 0500) SpO2:  [91 %-100 %] 99 % (09/04 0500) Weight:  [101.6 kg] 101.6 kg (09/03 1532)  Intake/Output from previous day: 09/03 0701 - 09/04 0700 In: 1320.9 [I.V.:1320.9] Out: 1225 [Urine:1200; Blood:25] Intake/Output this shift: No intake/output data recorded.  No results for input(s): HGB in the last 72 hours. No results for input(s): WBC, RBC, HCT, PLT in the last 72 hours. No results for input(s): NA, K, CL, CO2, BUN, CREATININE, GLUCOSE, CALCIUM in the last 72 hours. No results for input(s): LABPT, INR in the last 72 hours.  STILL C/O NUMB FINGERS BUT HE S DEF MOVING THEM BETTER  COLOR CAP REFILL NORMAL    Assessment/Plan: 1 Day Post-Op Procedure(s) (LRB): CARPAL TUNNEL RELEASE (Left) OPEN REDUCTION PERCUTANEOUS FIXATION, LEFT WRIST (Left) Advance diet D/C IV fluids DC TODAY     Taft Minerva 10/22/2023, 7:30 AM

## 2023-10-22 NOTE — Plan of Care (Signed)

## 2023-10-22 NOTE — Anesthesia Postprocedure Evaluation (Signed)
 Anesthesia Post Note  Patient: David Estrada  Procedure(s) Performed: CARPAL TUNNEL RELEASE (Left: Hand) OPEN REDUCTION PERCUTANEOUS FIXATION, LEFT WRIST (Left: Wrist)  Patient location during evaluation: PACU Anesthesia Type: General Level of consciousness: awake and alert Pain management: pain level controlled Vital Signs Assessment: post-procedure vital signs reviewed and stable Respiratory status: spontaneous breathing, nonlabored ventilation, respiratory function stable and patient connected to nasal cannula oxygen Cardiovascular status: blood pressure returned to baseline and stable Postop Assessment: no apparent nausea or vomiting Anesthetic complications: no   There were no known notable events for this encounter.   Last Vitals:  Vitals:   10/21/23 2342 10/21/23 2345  BP: (!) 125/55   Pulse:    Resp:    Temp: 37.2 C 37.2 C  SpO2: 96% 96%    Last Pain:  Vitals:   10/21/23 1933  TempSrc:   PainSc: 8                  Jenica Costilow L Geanie Pacifico

## 2023-10-22 NOTE — Discharge Summary (Signed)
 Physician Discharge Summary  Patient ID: David Estrada MRN: [id number] DOB/AGE: 2000/08/18 23 y.o.  Admit date: 10/21/2023 Discharge date: 10/22/2023  Admission Diagnoses: Median nerve entrapment fracture left wrist  Discharge Diagnoses:  Principal Problem:   Entrapment of left median nerve Fracture left wrist  Discharged Condition: stable  Hospital Course: The patient was admitted through the emergency room after surgery.  He underwent closed reduction in the emergency room for displaced distal radius fracture with ulnar fracture as well.  The patient's condition worsened in terms of numbness and tingling in his left upper extremity and he lost 2 point discrimination and therefore was taken to the operating for nerve decompression.  He had a very unstable fracture and required a pin for fixation to temporarily hold the fracture in place.  The fracture was noted to have a fragment that was pushing on the nerve and tenting it causing the pain paresthesias and loss of sensation  He tolerated the procedure well and was admitted to the hospital for pain control and observation to rule out compartment syndrome  He did well overnight and on postop day 1 he was comfortable although his fingers were still numb.  He did have improved mobility with good color and capillary refill  He had a CT scan to plan for further surgical intervention  I spoke to Dr. Ahmad yesterday and he has agreed to see the patient in follow-up for definitive fixation of ointment scheduled for Friday   Discharge Exam: Blood pressure (!) 173/84, pulse (!) 110, temperature 98.4 F (36.9 C), temperature source Oral, resp. rate 20, height 6' (1.829 m), weight 223 lb 15.8 oz (101.6 kg), SpO2 99%.  The patient was awake alert and oriented x 3 mood and affect were normal color capillary refill was good in the fingers he can move all of them well including full flexion extension  He did have decreased sensation in the digits  of the hand  Disposition: Discharge disposition: 01-Home or Self Care       Discharge Instructions     Call MD / Call 911   Complete by: As directed    If you experience chest pain or shortness of breath, CALL 911 and be transported to the hospital emergency room.  If you develope a fever above 101 F, pus (white drainage) or increased drainage or redness at the wound, or calf pain, call your surgeon's office.   Constipation Prevention   Complete by: As directed    Drink plenty of fluids.  Prune juice may be helpful.  You may use a stool softener, such as Colace (over the counter) 100 mg twice a day.  Use MiraLax (over the counter) for constipation as needed.   Diet - low sodium heart healthy   Complete by: As directed    Discharge instructions   Complete by: As directed    Right ankle: Weight-bear as tolerated in cam walker  Left wrist apply ice packs as needed to control swelling  Move fingers opening closing the hand into a tight fist 10 times per hour while awake  Sling use as needed   Driving restrictions   Complete by: As directed    No driving   Increase activity slowly as tolerated   Complete by: As directed    Post-operative opioid taper instructions:   Complete by: As directed    POST-OPERATIVE OPIOID TAPER INSTRUCTIONS: It is important to wean off of your opioid medication as soon as possible. If you do not need  pain medication after your surgery it is ok to stop day one. Opioids include: Codeine, Hydrocodone(Norco, Vicodin), Oxycodone (Percocet, oxycontin ) and hydromorphone  amongst others.  Long term and even short term use of opiods can cause: Increased pain response Dependence Constipation Depression Respiratory depression And more.  Withdrawal symptoms can include Flu like symptoms Nausea, vomiting And more Techniques to manage these symptoms Hydrate well Eat regular healthy meals Stay active Use relaxation techniques(deep breathing, meditating,  yoga) Do Not substitute Alcohol to help with tapering If you have been on opioids for less than two weeks and do not have pain than it is ok to stop all together.  Plan to wean off of opioids This plan should start within one week post op of your joint replacement. Maintain the same interval or time between taking each dose and first decrease the dose.  Cut the total daily intake of opioids by one tablet each day Next start to increase the time between doses. The last dose that should be eliminated is the evening dose.         Allergies as of 10/22/2023   No Known Allergies      Medication List     TAKE these medications    calcium carbonate 500 MG chewable tablet Commonly known as: TUMS - dosed in mg elemental calcium Chew 1 tablet by mouth daily as needed for indigestion or heartburn.   oxyCODONE -acetaminophen  5-325 MG tablet Commonly known as: Percocet Take 1 tablet by mouth every 4 (four) hours as needed for severe pain (pain score 7-10).        Follow-up Information     Margrette Taft BRAVO, MD Follow up on 10/30/2023.   Specialties: Orthopedic Surgery, Radiology Why: X-ray right ankle Contact information: [address] [place] [address] [phone]         Shari Easter, MD. Schedule an appointment as soon as possible for a visit.   Specialty: Orthopedic Surgery Why: Call today to make an appointment for tomorrow to see the hand specialist Contact information: [address] [place] [address] [phone]                 Signed: Taft Margrette 10/22/2023, 12:09 PM

## 2023-10-23 ENCOUNTER — Ambulatory Visit: Admitting: Orthopedic Surgery

## 2023-10-23 ENCOUNTER — Telehealth: Payer: Self-pay | Admitting: Orthopedic Surgery

## 2023-10-23 NOTE — Telephone Encounter (Signed)
 Spoke w/this pt, he stated you had him go to a hand specialist today, he stated his ankle is fx too.  He wants to know if you want him to come here for his ankle or what you'd like him to do regarding his ankle. [phone]

## 2023-10-30 ENCOUNTER — Encounter: Payer: Self-pay | Admitting: Orthopedic Surgery

## 2023-10-30 ENCOUNTER — Other Ambulatory Visit (INDEPENDENT_AMBULATORY_CARE_PROVIDER_SITE_OTHER): Payer: Self-pay

## 2023-10-30 ENCOUNTER — Ambulatory Visit (INDEPENDENT_AMBULATORY_CARE_PROVIDER_SITE_OTHER): Admitting: Orthopedic Surgery

## 2023-10-30 DIAGNOSIS — S82831S Other fracture of upper and lower end of right fibula, sequela: Secondary | ICD-10-CM | POA: Diagnosis not present

## 2023-10-30 DIAGNOSIS — S82301S Unspecified fracture of lower end of right tibia, sequela: Secondary | ICD-10-CM

## 2023-10-30 DIAGNOSIS — S82891A Other fracture of right lower leg, initial encounter for closed fracture: Secondary | ICD-10-CM

## 2023-10-30 NOTE — Progress Notes (Signed)
 Fracture care and consult follow-up and postop follow-up  Status post lateral malleolus fracture treated with CAM Walker weight-bear as tolerated  Status post open carpal tunnel release for median nerve entrapment secondary to wrist fracture status post open reduction and K wire fixation of wrist  Subsequent fracture by Dr. Ahmad for the wrist  Ankle fracture shows no displacement of the mortise mild rotational position of the fibula but stable  Weight-bear as tolerated CAM Walker x-ray 4 weeks right ankle

## 2023-10-30 NOTE — Progress Notes (Signed)
   There were no vitals taken for this visit.  There is no height or weight on file to calculate BMI.  Chief Complaint  Patient presents with   Ankle Injury    Right 10/21/23 fracture    No diagnosis found.  DOI/DOS/ Date: 10/21/23  Improved

## 2023-11-23 ENCOUNTER — Encounter: Payer: Self-pay | Admitting: Family Medicine

## 2023-11-26 ENCOUNTER — Telehealth: Payer: Self-pay

## 2023-11-26 DIAGNOSIS — S82891D Other fracture of right lower leg, subsequent encounter for closed fracture with routine healing: Secondary | ICD-10-CM | POA: Insufficient documentation

## 2023-11-26 NOTE — Telephone Encounter (Signed)
 Copied from CRM #[id number]. Topic: Clinical - Red Word Triage >> Nov 26, 2023  2:23 PM Olam RAMAN wrote: Red Word that prompted transfer to Nurse Triage:  pt complaining of stomach pain on left side, pt denied to speak to nurse and wanted appt instead since it has been on his past history CALLER DECLINED TO SPEAK TO NURSE

## 2023-11-27 ENCOUNTER — Encounter: Payer: Self-pay | Admitting: Orthopedic Surgery

## 2023-11-27 ENCOUNTER — Ambulatory Visit (INDEPENDENT_AMBULATORY_CARE_PROVIDER_SITE_OTHER): Admitting: Orthopedic Surgery

## 2023-11-27 ENCOUNTER — Other Ambulatory Visit (INDEPENDENT_AMBULATORY_CARE_PROVIDER_SITE_OTHER): Payer: Self-pay

## 2023-11-27 VITALS — Wt 206.6 lb

## 2023-11-27 DIAGNOSIS — S82891D Other fracture of right lower leg, subsequent encounter for closed fracture with routine healing: Secondary | ICD-10-CM | POA: Diagnosis not present

## 2023-11-27 DIAGNOSIS — S82301S Unspecified fracture of lower end of right tibia, sequela: Secondary | ICD-10-CM

## 2023-11-27 NOTE — Progress Notes (Signed)
    11/27/2023   Chief Complaint  Patient presents with   Follow-up    4 week follow up right ankle    Encounter Diagnosis  Name Primary?   Fracture of right ankle, closed, with routine healing, subsequent encounter 10/21/23 Yes    What pharmacy do you use ? [facility] [place]__  DOI/DOS/ Date: 4 weeks

## 2023-11-27 NOTE — Telephone Encounter (Signed)
 Patient does not want to speak to nurse, patient scheduled 10/16

## 2023-11-27 NOTE — Progress Notes (Signed)
    11/27/2023   Chief Complaint  Patient presents with   Follow-up    4 week follow up right ankle    Encounter Diagnosis  Name Primary?   Fracture of right ankle, closed, with routine healing, subsequent encounter 10/21/23 Yes    What pharmacy do you use ? [facility] [place]  DOI/DOS/ Date: 4 weeks   Lateral malleolus fracture no tenderness at fracture site patient ambulates independently without a limp DG Ankle Complete Right Result Date: 11/27/2023 Ankle complete right Right fibular fracture X-ray shows Weber B type fibular fracture with intact ankle mortise Some of the fracture line is still visible but clinically the patient is asymptomatic and clinically has healed the fracture   DG Ankle Complete Right Result Date: 10/30/2023 Right ankle fracture Right ankle x-rays X-rays show intact ankle mortise slight rotation of the distal fibula fracture no medial fracture is seen on this x-ray there was a bone chip on the previous x-ray Stable fracture at almost 2 weeks including with weightbearing in a cam walker lateral malleolus     Recommend normal activities for his ankle return to work light duty.  Patient still undergoing treatment for his severe left wrist fracture which required 3 surgeries for reconstruction

## 2023-12-03 ENCOUNTER — Ambulatory Visit: Payer: Self-pay | Admitting: Family Medicine

## 2023-12-03 VITALS — BP 148/96 | HR 125 | Ht 71.0 in | Wt 208.2 lb

## 2023-12-03 DIAGNOSIS — Z13220 Encounter for screening for lipoid disorders: Secondary | ICD-10-CM

## 2023-12-03 DIAGNOSIS — R1012 Left upper quadrant pain: Secondary | ICD-10-CM | POA: Diagnosis not present

## 2023-12-03 NOTE — Patient Instructions (Signed)
 Referral placed.  Labs today.  Take care  Dr. Bluford

## 2023-12-04 LAB — CMP14+EGFR
ALT: 39 IU/L (ref 0–44)
AST: 30 IU/L (ref 0–40)
Albumin: 5.1 g/dL (ref 4.3–5.2)
Alkaline Phosphatase: 88 IU/L (ref 47–123)
BUN/Creatinine Ratio: 16 (ref 9–20)
BUN: 15 mg/dL (ref 6–20)
Bilirubin Total: 1 mg/dL (ref 0.0–1.2)
CO2: 22 mmol/L (ref 20–29)
Calcium: 10.1 mg/dL (ref 8.7–10.2)
Chloride: 96 mmol/L (ref 96–106)
Creatinine, Ser: 0.92 mg/dL (ref 0.76–1.27)
Globulin, Total: 2.9 g/dL (ref 1.5–4.5)
Glucose: 85 mg/dL (ref 70–99)
Potassium: 4.2 mmol/L (ref 3.5–5.2)
Sodium: 137 mmol/L (ref 134–144)
Total Protein: 8 g/dL (ref 6.0–8.5)
eGFR: 119 mL/min/1.73 (ref >59)

## 2023-12-04 LAB — CBC
Hematocrit: 51.5 % — ABNORMAL HIGH (ref 37.5–51.0)
Hemoglobin: 17.3 g/dL (ref 13.0–17.7)
MCH: 30.1 pg (ref 26.6–33.0)
MCHC: 33.6 g/dL (ref 31.5–35.7)
MCV: 90 fL (ref 79–97)
Platelets: 326 x10E3/uL (ref 150–450)
RBC: 5.74 x10E6/uL (ref 4.14–5.80)
RDW: 12.7 % (ref 11.6–15.4)
WBC: 8.1 x10E3/uL (ref 3.4–10.8)

## 2023-12-04 LAB — LIPID PANEL
Chol/HDL Ratio: 4.1 ratio (ref 0.0–5.0)
Cholesterol, Total: 177 mg/dL (ref 100–199)
HDL: 43 mg/dL (ref >39)
LDL Chol Calc (NIH): 116 mg/dL — ABNORMAL HIGH (ref 0–99)
Triglycerides: 100 mg/dL (ref 0–149)
VLDL Cholesterol Cal: 18 mg/dL (ref 5–40)

## 2023-12-04 LAB — LIPASE: Lipase: 30 U/L (ref 13–78)

## 2023-12-06 ENCOUNTER — Ambulatory Visit: Payer: Self-pay | Admitting: Family Medicine

## 2023-12-06 NOTE — Assessment & Plan Note (Signed)
 Mild, intermittent. Normal exam. No red flags. Given persistent nature, referring to GI. Labs today.

## 2023-12-06 NOTE — Progress Notes (Signed)
 Subjective:  Patient ID: David Estrada, male    DOB: 06-Apr-2000  Age: 23 y.o. MRN: [id number]  CC:  LUQ pain   HPI:  23 year old male presents for evaluation of the above.  Patient saw me for this last year. He reports that he continues to have intermittent LUQ pain. No resolution with PPI. Typically last briefly. Mild in severity. He is concerned because he continues to have this but it is manageable. No fever. No weight loss. No diarrhea or constipation.   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   LUQ pain 01/09/2023   History of palpitations 12/07/2020   White coat syndrome without diagnosis of hypertension 12/07/2020    Social Hx   Social History   Socioeconomic History   Marital status: Single    Spouse name: Not on file   Number of children: Not on file   Years of education: Not on file   Highest education level: Not on file  Occupational History   Not on file  Tobacco Use   Smoking status: Never   Smokeless tobacco: Never  Substance and Sexual Activity   Alcohol use: Never   Drug use: Never   Sexual activity: Not on file  Other Topics Concern   Not on file  Social History Narrative   Not on file   Social Drivers of Health   Financial Resource Strain: Not on file  Food Insecurity: No Food Insecurity (10/22/2023)   Hunger Vital Sign    Worried About Running Out of Food in the Last Year: Never true    Ran Out of Food in the Last Year: Never true  Transportation Needs: No Transportation Needs (10/22/2023)   PRAPARE - Administrator, Civil Service (Medical): No    Lack of Transportation (Non-Medical): No  Physical Activity: Not on file  Stress: Not on file  Social Connections: Not on file    Review of Systems Per HPI  Objective:  BP (!)  148/96   Pulse (!) 125   Ht 5' 11 (1.803 m)   Wt 208 lb 4 oz (94.5 kg)   BMI 29.04 kg/m      12/03/2023    1:13 PM 11/27/2023   10:22 AM 10/22/2023   12:16 PM  BP/Weight  Systolic BP 148  862  Diastolic BP 96  79  Wt. (Lbs) 208.25 206.6   BMI 29.04 kg/m2 28.02 kg/m2     Physical Exam Vitals and nursing note reviewed.  Constitutional:      General: He is not in acute distress.    Appearance: Normal appearance.  HENT:     Head: Normocephalic and atraumatic.  Eyes:     General:        Right eye: No discharge.        Left eye: No discharge.     Conjunctiva/sclera: Conjunctivae normal.  Cardiovascular:     Rate and Rhythm: Normal rate and regular rhythm.  Pulmonary:     Effort: Pulmonary effort is normal.     Breath sounds: Normal breath sounds. No wheezing, rhonchi or rales.  Abdominal:     General: There is no distension.     Palpations: Abdomen is soft.     Tenderness: There is no abdominal tenderness.  Neurological:  Mental Status: He is alert.  Psychiatric:        Mood and Affect: Mood normal.        Behavior: Behavior normal.     Lab Results  Component Value Date   WBC 8.1 12/03/2023   HGB 17.3 12/03/2023   HCT 51.5 (H) 12/03/2023   PLT 326 12/03/2023   GLUCOSE 85 12/03/2023   CHOL 177 12/03/2023   TRIG 100 12/03/2023   HDL 43 12/03/2023   LDLCALC 116 (H) 12/03/2023   ALT 39 12/03/2023   AST 30 12/03/2023   NA 137 12/03/2023   K 4.2 12/03/2023   CL 96 12/03/2023   CREATININE 0.92 12/03/2023   BUN 15 12/03/2023   CO2 22 12/03/2023   TSH 1.620 09/16/2019     Assessment & Plan:  LUQ pain Assessment & Plan: Mild, intermittent. Normal exam. No red flags. Given persistent nature, referring to GI. Labs today.   Orders: -     Ambulatory referral to Gastroenterology -     CBC -     CMP14+EGFR -     Lipase  Screening, lipid -     Lipid panel    Follow-up:  Return if symptoms worsen or fail to improve.  Jacqulyn Ahle DO [facility]

## 2023-12-07 ENCOUNTER — Other Ambulatory Visit: Payer: Self-pay | Admitting: Family Medicine

## 2023-12-07 DIAGNOSIS — R6882 Decreased libido: Secondary | ICD-10-CM

## 2023-12-10 ENCOUNTER — Ambulatory Visit: Payer: Self-pay | Admitting: Family Medicine

## 2023-12-11 LAB — TESTOSTERONE,FREE AND TOTAL
Testosterone, Free: 20.5 pg/mL (ref 9.3–26.5)
Testosterone: 523 ng/dL (ref 264–916)

## 2023-12-14 ENCOUNTER — Encounter: Payer: Self-pay | Admitting: Gastroenterology

## 2023-12-24 ENCOUNTER — Encounter: Payer: Self-pay | Admitting: Gastroenterology

## 2023-12-24 ENCOUNTER — Encounter: Payer: Self-pay | Admitting: *Deleted

## 2023-12-24 ENCOUNTER — Ambulatory Visit: Admitting: Gastroenterology

## 2023-12-24 VITALS — BP 163/80 | HR 106 | Temp 98.7°F | Ht 71.0 in | Wt 211.2 lb

## 2023-12-24 DIAGNOSIS — Z800 Family history of malignant neoplasm of digestive organs: Secondary | ICD-10-CM | POA: Diagnosis not present

## 2023-12-24 DIAGNOSIS — R030 Elevated blood-pressure reading, without diagnosis of hypertension: Secondary | ICD-10-CM

## 2023-12-24 DIAGNOSIS — R198 Other specified symptoms and signs involving the digestive system and abdomen: Secondary | ICD-10-CM

## 2023-12-24 DIAGNOSIS — K8050 Calculus of bile duct without cholangitis or cholecystitis without obstruction: Secondary | ICD-10-CM

## 2023-12-24 DIAGNOSIS — R1012 Left upper quadrant pain: Secondary | ICD-10-CM | POA: Diagnosis not present
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
## 2023-12-24 NOTE — Patient Instructions (Addendum)
 We will get you scheduled for an ultrasound of her abdomen to rule out biliary colic as cause of your abdominal pain or assess for any other potential cause of abdominal pain in the quadrant including any abnormality with the spleen.  I want you to do a trial of famotidine 20 mg once daily at least 30 minutes prior to breakfast for 3 weeks.  If symptoms do not return then this is likely some mild gastritis.  At that point I would recommend following a GERD diet as below and continuing Pepcid as needed.  You can also use Tums if you need quick relief.   GERD diet:  Avoid fried, fatty, greasy, spicy, citrus foods. Avoid caffeine and carbonated beverages. Avoid chocolate. Try eating 4-6 small meals a day rather than 3 large meals. Do not eat within 3 hours of laying down. Prop head of bed up on wood or bricks to create a 6 inch incline.  If you have no improvement of symptoms or symptoms return shortly after that I do recommend we ruling out a bacteria called H. pylori that can cause similar symptoms.  Please call me or send a MyChart message with a progress report in 2-3 weeks to let me know how you are doing.  Please also let me know if you have any changes symptoms.  As we discussed given your mother's diagnosis of colon cancer at the age of 25, we recommend screening 10 years prior to age of diagnosis which would be age 56 for you.  If you start developing any change in bowel habits, black stool, rectal bleeding, development of constipation or diarrhea without other cause, weight loss, or changes in appetite then we should consider performing this sooner.  Lets plan to follow-up in 2 months, we will be chatting in between the check-in to see how you are doing and to give the results of your ultrasound.  It was a pleasure to see you today. I want to create trusting relationships with patients. If you receive a survey regarding your visit,  I greatly appreciate you taking time to fill this out on  paper or through your MyChart. I value your feedback.  Charmaine Melia, MSN, FNP-BC, AGACNP-BC [facility]

## 2024-01-01 ENCOUNTER — Ambulatory Visit (HOSPITAL_COMMUNITY)
Admission: RE | Admit: 2024-01-01 | Discharge: 2024-01-01 | Disposition: A | Discharge status: Home / Self Care | Source: Ambulatory Visit | Attending: Gastroenterology | Admitting: Gastroenterology

## 2024-01-01 ENCOUNTER — Ambulatory Visit: Payer: Self-pay | Admitting: Gastroenterology

## 2024-01-01 DIAGNOSIS — K8050 Calculus of bile duct without cholangitis or cholecystitis without obstruction: Secondary | ICD-10-CM | POA: Insufficient documentation

## 2024-01-01 DIAGNOSIS — R1012 Left upper quadrant pain: Secondary | ICD-10-CM

## 2024-01-01 DIAGNOSIS — R161 Splenomegaly, not elsewhere classified: Secondary | ICD-10-CM

## 2024-01-04 ENCOUNTER — Other Ambulatory Visit: Payer: Self-pay | Admitting: Gastroenterology

## 2024-01-04 DIAGNOSIS — R1012 Left upper quadrant pain: Secondary | ICD-10-CM

## 2024-01-04 DIAGNOSIS — R161 Splenomegaly, not elsewhere classified: Secondary | ICD-10-CM

## 2024-01-04 DIAGNOSIS — R198 Other specified symptoms and signs involving the digestive system and abdomen: Secondary | ICD-10-CM

## 2024-01-06 LAB — EBV AB TO VIRAL CAPSID AG PNL, IGG+IGM
EBV VCA IgG: <18 U/mL (ref 0.0–17.9)
EBV VCA IgM: <36 U/mL (ref 0.0–35.9)

## 2024-01-06 LAB — CMV ABS, IGG+IGM (CYTOMEGALOVIRUS)
CMV Ab - IgG: 9.9 U/mL — ABNORMAL HIGH (ref 0.00–0.59)
CMV IgM Ser EIA-aCnc: <30 [AU]/ml (ref 0.0–29.9)

## 2024-01-07 LAB — H. PYLORI BREATH TEST

## 2024-01-07 LAB — MONONUCLEOSIS SCREEN: Mono Screen: NEGATIVE

## 2024-01-08 ENCOUNTER — Ambulatory Visit: Payer: Self-pay | Admitting: Gastroenterology

## 2024-01-11 NOTE — Telephone Encounter (Signed)
 Referral sent

## 2024-01-13 ENCOUNTER — Encounter: Payer: Self-pay | Admitting: Medical Oncology

## 2024-01-13 ENCOUNTER — Inpatient Hospital Stay

## 2024-01-13 ENCOUNTER — Inpatient Hospital Stay: Attending: Medical Oncology | Admitting: Medical Oncology

## 2024-01-13 VITALS — BP 169/87 | HR 131 | Temp 98.0°F | Resp 16 | Wt 210.5 lb

## 2024-01-13 DIAGNOSIS — Z800 Family history of malignant neoplasm of digestive organs: Secondary | ICD-10-CM | POA: Insufficient documentation

## 2024-01-13 DIAGNOSIS — R161 Splenomegaly, not elsewhere classified: Secondary | ICD-10-CM

## 2024-01-13 LAB — COMPREHENSIVE METABOLIC PANEL WITH GFR
ALT: 30 U/L (ref 0–44)
AST: 29 U/L (ref 15–41)
Albumin: 5 g/dL (ref 3.5–5.0)
Alkaline Phosphatase: 77 U/L (ref 38–126)
Anion gap: 10 (ref 5–15)
BUN: 11 mg/dL (ref 6–20)
CO2: 29 mmol/L (ref 22–32)
Calcium: 9.7 mg/dL (ref 8.9–10.3)
Chloride: 100 mmol/L (ref 98–111)
Creatinine, Ser: 0.82 mg/dL (ref 0.61–1.24)
GFR, Estimated: >60 mL/min (ref >60)
Glucose, Bld: 94 mg/dL (ref 70–99)
Potassium: 4.2 mmol/L (ref 3.5–5.1)
Sodium: 139 mmol/L (ref 135–145)
Total Bilirubin: 1 mg/dL (ref 0.0–1.2)
Total Protein: 8.2 g/dL — ABNORMAL HIGH (ref 6.5–8.1)

## 2024-01-13 LAB — CBC WITH DIFFERENTIAL/PLATELET
Abs Immature Granulocytes: 0.02 K/uL (ref 0.00–0.07)
Basophils Absolute: 0 K/uL (ref 0.0–0.1)
Basophils Relative: 1 %
Eosinophils Absolute: 0.1 K/uL (ref 0.0–0.5)
Eosinophils Relative: 1 %
HCT: 48.1 % (ref 39.0–52.0)
Hemoglobin: 17.5 g/dL — ABNORMAL HIGH (ref 13.0–17.0)
Immature Granulocytes: 0 %
Lymphocytes Relative: 19 %
Lymphs Abs: 1.3 K/uL (ref 0.7–4.0)
MCH: 30.9 pg (ref 26.0–34.0)
MCHC: 36.4 g/dL — ABNORMAL HIGH (ref 30.0–36.0)
MCV: 85 fL (ref 80.0–100.0)
Monocytes Absolute: 0.4 K/uL (ref 0.1–1.0)
Monocytes Relative: 6 %
Neutro Abs: 4.8 K/uL (ref 1.7–7.7)
Neutrophils Relative %: 73 %
Platelets: 236 K/uL (ref 150–400)
RBC: 5.66 MIL/uL (ref 4.22–5.81)
RDW: 11.9 % (ref 11.5–15.5)
WBC: 6.7 K/uL (ref 4.0–10.5)
nRBC: 0 % (ref 0.0–0.2)

## 2024-01-13 LAB — TECHNOLOGIST SMEAR REVIEW: Plt Morphology: NORMAL
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
## 2024-01-14 LAB — SYPHILIS: RPR W/REFLEX TO RPR TITER AND TREPONEMAL ANTIBODIES, TRADITIONAL SCREENING AND DIAGNOSIS ALGORITHM: RPR Ser Ql: NONREACTIVE

## 2024-01-18 ENCOUNTER — Ambulatory Visit: Payer: Self-pay | Admitting: Medical Oncology

## 2024-01-18 NOTE — Telephone Encounter (Signed)
 Sarah, I will have them move his appointments up.

## 2024-02-10 ENCOUNTER — Encounter: Payer: Self-pay | Admitting: Gastroenterology

## 2024-02-15 ENCOUNTER — Encounter: Payer: Self-pay | Admitting: *Deleted

## 2024-03-09 ENCOUNTER — Other Ambulatory Visit: Payer: Self-pay

## 2024-03-09 DIAGNOSIS — R161 Splenomegaly, not elsewhere classified: Secondary | ICD-10-CM

## 2024-03-10 ENCOUNTER — Inpatient Hospital Stay: Attending: Medical Oncology

## 2024-03-10 DIAGNOSIS — R161 Splenomegaly, not elsewhere classified: Secondary | ICD-10-CM | POA: Diagnosis not present

## 2024-03-10 DIAGNOSIS — D751 Secondary polycythemia: Secondary | ICD-10-CM | POA: Diagnosis present

## 2024-03-10 DIAGNOSIS — Z800 Family history of malignant neoplasm of digestive organs: Secondary | ICD-10-CM | POA: Diagnosis not present

## 2024-03-10 LAB — COMPREHENSIVE METABOLIC PANEL WITH GFR
ALT: 23 U/L (ref 0–44)
AST: 22 U/L (ref 15–41)
Albumin: 4.9 g/dL (ref 3.5–5.0)
Alkaline Phosphatase: 78 U/L (ref 38–126)
Anion gap: 11 (ref 5–15)
BUN: 9 mg/dL (ref 6–20)
CO2: 28 mmol/L (ref 22–32)
Calcium: 9.6 mg/dL (ref 8.9–10.3)
Chloride: 99 mmol/L (ref 98–111)
Creatinine, Ser: 0.88 mg/dL (ref 0.61–1.24)
GFR, Estimated: >60 mL/min
Glucose, Bld: 85 mg/dL (ref 70–99)
Potassium: 4 mmol/L (ref 3.5–5.1)
Sodium: 138 mmol/L (ref 135–145)
Total Bilirubin: 1.1 mg/dL (ref 0.0–1.2)
Total Protein: 8 g/dL (ref 6.5–8.1)

## 2024-03-10 LAB — CBC WITH DIFFERENTIAL/PLATELET
Abs Immature Granulocytes: 0.01 K/uL (ref 0.00–0.07)
Basophils Absolute: 0 K/uL (ref 0.0–0.1)
Basophils Relative: 1 %
Eosinophils Absolute: 0.1 K/uL (ref 0.0–0.5)
Eosinophils Relative: 1 %
HCT: 49.8 % (ref 39.0–52.0)
Hemoglobin: 17.9 g/dL — ABNORMAL HIGH (ref 13.0–17.0)
Immature Granulocytes: 0 %
Lymphocytes Relative: 22 %
Lymphs Abs: 1.4 K/uL (ref 0.7–4.0)
MCH: 30.7 pg (ref 26.0–34.0)
MCHC: 35.9 g/dL (ref 30.0–36.0)
MCV: 85.4 fL (ref 80.0–100.0)
Monocytes Absolute: 0.5 K/uL (ref 0.1–1.0)
Monocytes Relative: 7 %
Neutro Abs: 4.5 K/uL (ref 1.7–7.7)
Neutrophils Relative %: 69 %
Platelets: 245 K/uL (ref 150–400)
RBC: 5.83 MIL/uL — ABNORMAL HIGH (ref 4.22–5.81)
RDW: 12.8 % (ref 11.5–15.5)
WBC: 6.5 K/uL (ref 4.0–10.5)
nRBC: 0 % (ref 0.0–0.2)

## 2024-03-17 ENCOUNTER — Inpatient Hospital Stay

## 2024-03-17 ENCOUNTER — Inpatient Hospital Stay: Admitting: Oncology

## 2024-03-17 VITALS — BP 152/95 | HR 120 | Temp 98.5°F | Resp 20 | Ht 72.0 in | Wt 218.0 lb

## 2024-03-17 DIAGNOSIS — R161 Splenomegaly, not elsewhere classified: Secondary | ICD-10-CM | POA: Diagnosis not present

## 2024-03-17 DIAGNOSIS — D751 Secondary polycythemia: Secondary | ICD-10-CM | POA: Diagnosis not present

## 2024-03-17 LAB — CBC WITH DIFFERENTIAL/PLATELET
Abs Immature Granulocytes: 0.01 10*3/uL (ref 0.00–0.07)
Basophils Absolute: 0 10*3/uL (ref 0.0–0.1)
Basophils Relative: 0 %
Eosinophils Absolute: 0.1 10*3/uL (ref 0.0–0.5)
Eosinophils Relative: 1 %
HCT: 50.2 % (ref 39.0–52.0)
Hemoglobin: 18 g/dL — ABNORMAL HIGH (ref 13.0–17.0)
Immature Granulocytes: 0 %
Lymphocytes Relative: 22 %
Lymphs Abs: 1.4 10*3/uL (ref 0.7–4.0)
MCH: 30.5 pg (ref 26.0–34.0)
MCHC: 35.9 g/dL (ref 30.0–36.0)
MCV: 84.9 fL (ref 80.0–100.0)
Monocytes Absolute: 0.4 10*3/uL (ref 0.1–1.0)
Monocytes Relative: 6 %
Neutro Abs: 4.3 10*3/uL (ref 1.7–7.7)
Neutrophils Relative %: 71 %
Platelets: 252 10*3/uL (ref 150–400)
RBC: 5.91 MIL/uL — ABNORMAL HIGH (ref 4.22–5.81)
RDW: 12.6 % (ref 11.5–15.5)
WBC: 6.2 10*3/uL (ref 4.0–10.5)
nRBC: 0 % (ref 0.0–0.2)

## 2024-03-17 LAB — RETICULOCYTES
Immature Retic Fract: 3.7 % (ref 2.3–15.9)
RBC.: 5.85 MIL/uL — ABNORMAL HIGH (ref 4.22–5.81)
Retic Count, Absolute: 133.4 10*3/uL (ref 19.0–186.0)
Retic Ct Pct: 2.3 % (ref 0.4–3.1)

## 2024-03-17 NOTE — Progress Notes (Signed)
 "        Patient Care Team: Cook, Jayce G, DO as PCP - General (Family Medicine)   Name of the patient: David Estrada  [id number]  Nov 25, 2000   Date of visit: 03/17/24  History of Presenting Illness-patient is a 24 year old male who was evaluated for on and off left upper quadrant pain for about a year.  He was referred to GI who did abdominal ultrasound which found an enlarged spleen without any other abnormality.  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  He does not know when this occurred and is otherwise asymptomatic.  Patient presents today with his girlfriend.  Reports appetite and energy levels are 100%.  He has on and off pain in his left upper abdomen but otherwise is doing well.  He has severe health anxiety and just wants answers as to why his labs seem abnormal as well as why his spleen is enlarged.  Reports itching while in the shower typically in his inner thighs.  Has a tingling sensation on his face at times.  No headaches or migraines has history of floaters in his eyes.  He denies any abdominal pain, melena, hematochezia or bright red blood per rectum.   Review of systems- Review of Systems  Constitutional:  Negative for malaise/fatigue.  Cardiovascular:  Positive for palpitations.  Neurological:  Positive for dizziness, tingling and sensory change.     No Known Allergies  Past Medical History:  Diagnosis Date   Heart murmur     Past Surgical History:  Procedure Laterality Date   CARPAL TUNNEL RELEASE Left 10/21/2023   Procedure: CARPAL TUNNEL RELEASE;  Surgeon: Margrette Taft BRAVO, MD;  Location: [facility];  Service: Orthopedics;  Laterality: Left;  maximum relaxation   EXTERNAL FIXATION ARM Left 10/21/2023   Procedure: OPEN REDUCTION PERCUTANEOUS FIXATION, LEFT WRIST;  Surgeon: Margrette Taft BRAVO, MD;  Location: [facility];  Service: Orthopedics;  Laterality: Left;  k wires,  c arm    Social History   Socioeconomic History   Marital status:  Single    Spouse name: Not on file   Number of children: Not on file   Years of education: Not on file   Highest education level: Not on file  Occupational History   Not on file  Tobacco Use   Smoking status: Never   Smokeless tobacco: Never  Vaping Use   Vaping status: Never Used  Substance and Sexual Activity   Alcohol use: Never    Comment: occassional/social   Drug use: Never   Sexual activity: Not on file  Other Topics Concern   Not on file  Social History Narrative   Not on file   Social Drivers of Health   Tobacco Use: Low Risk (01/13/2024)   Patient History    Smoking Tobacco Use: Never    Smokeless Tobacco Use: Never    Passive Exposure: Not on file  Financial Resource Strain: Not on file  Food Insecurity: No Food Insecurity (01/13/2024)   Epic    Worried About Programme Researcher, Broadcasting/film/video in the Last Year: Never true    Ran Out of Food in the Last Year: Never true  Transportation Needs: No Transportation Needs (01/13/2024)   Epic    Lack of Transportation (Medical): No    Lack of Transportation (Non-Medical): No  Physical Activity: Not on file  Stress: Not on file  Social Connections: Not on file  Intimate Partner Violence: Not At Risk (01/13/2024)   Epic  Fear of Current or Ex-Partner: No    Emotionally Abused: No    Physically Abused: No    Sexually Abused: No  Depression (PHQ2-9): Low Risk (03/17/2024)   Depression (PHQ2-9)    PHQ-2 Score: 0  Alcohol Screen: Not on file  Housing: Low Risk (01/13/2024)   Epic    Unable to Pay for Housing in the Last Year: No    Number of Times Moved in the Last Year: 1    Homeless in the Last Year: No  Utilities: Not At Risk (01/13/2024)   Epic    Threatened with loss of utilities: No  Health Literacy: Not on file    Immunization History  Administered Date(s) Administered   DTaP 08/07/1999, 10/07/1999, 12/09/1999, 12/07/2000, 08/26/2004   HIB (PRP-OMP) 08/07/1999, 10/07/1999, 12/09/1999, 12/07/2000   HPV 9-valent  09/15/2018, 12/08/2018   Hepatitis A, Ped/Adol-2 Dose 12/27/2012, 11/12/2017   Hepatitis B [birth year], 1999, 08/07/1999, 03/09/2000   IPV 08/07/1999, 10/07/1999, 03/09/2000, 08/26/2004   Influenza,Quad,Nasal, Live 12/27/2012   Influenza,inj,Quad PF,6+ Mos 12/08/2018, 12/07/2020   MMR 06/08/2000, 08/26/2004   Meningococcal Conjugate 09/23/2010, 11/12/2017   Moderna Sars-Covid-2 Vaccination 03/22/2019, 04/19/2019   Pneumococcal Conjugate-13 08/07/1999, 10/07/1999, 03/09/2000, 06/22/2001   Tdap 09/23/2010, 12/07/2020   Varicella 06/08/2000, 09/23/2010    Family History  Problem Relation Age of Onset   Hyperlipidemia Mother    Colon cancer Mother 18       stage 2/3   Diabetes Father    Hypertension Father    Diabetes type I Maternal Grandfather    Colon cancer Maternal Uncle    Diabetes type I Maternal Uncle    Liver disease Neg Hx    Gallbladder disease Neg Hx     No current outpatient medications on file.  Physical exam: Exam limited due to telemedicine Physical Exam Constitutional:      Appearance: Normal appearance.  Cardiovascular:     Rate and Rhythm: Normal rate and regular rhythm.  Pulmonary:     Effort: Pulmonary effort is normal.     Breath sounds: Normal breath sounds.  Abdominal:     General: Bowel sounds are normal.     Palpations: Abdomen is soft.  Musculoskeletal:        General: No swelling. Normal range of motion.  Neurological:     Mental Status: He is alert and oriented to person, place, and time. Mental status is at baseline.      Visit Diagnosis No diagnosis found.   Assessment and plan- Patient is a 24 y.o. male who was referred to us  for splenomegaly.   Assessment & Plan Erythrocytosis Noted incidentally on most recent lab draw. Labs from 2 months ago show hemoglobin of 17.5 hematocrit 48.1.  Most recent lab work shows hemoglobin 17.9 with hematocrit of 49.8. He denies any hyperviscosity symptoms.  There are two types of polycythemia, Primary  polycythemia and secondary polycythemia. Primary polycythemia is overproduction of red blood cells due to a driver mutation. The most common mutation is the JAK2 [id number] (95% of cases), but there are other mutations which can cause this disorder. Primary polycythemia is a myeloproliferative neoplasm which may require cytoreductive therapy to decrease risk of thrombosis. This can consist of medications or phlebotomy to drive down the red blood cell counts. Secondary polycythemia is polycythemia driven by low oxygen levels. This represents an appropriate response of the body attempting to increase red cell volume. Causes of secondary polycythemia include smoking (most common), obstructive sleep apnea (OSA), or living at altitude. This can also  be caused by testosterone  supplementation. Certain thalassemias can present with marked erythrocytosis , but normal hemoglobin. Secondary polycythemia does not have the same level of thrombotic risk and therefore does not require cytoreductive therapy or phlebotomy.   --workup to include CBC, CMP, reticulocyte count, and erythropoietin level --patient is a non smoker and does not use any testosterone  containing products --no signs of symptoms concerning for OSA --will order MPN workup to include JAK2 with reflex and BCR/ABL FISH --RTC in 3-4 weeks or sooner if indicated by the above labs.    Splenomegaly Labs from 01/05/2024 shows CMV IgG negative IgM.  Negative EBV. Additional labs from 01/13/2024 showed a negative syphilis testing and smear showed teardrop cells/reactive lymphocytes.  CBC and CMP were unremarkable except for mild erythrocytosis. Ultrasound of his liver on 01/01/2024 showed enlarged spleen with estimated volume of 743 mL. He is scheduled for a repeat in a few months.    I spent 30 minutes dedicated to the care of this patient (face-to-face and non-face-to-face) on the date of the encounter to include what is described in the assessment and  plan.   Thank you for allowing me to participate in the care of this very pleasant patient.   Delon Hope, AGNP-C Department of Hematology/Oncology [facility]  Phone: [phone]  03/17/2024 2:56 PM   "

## 2024-03-17 NOTE — Assessment & Plan Note (Addendum)
 Labs from 01/05/2024 shows CMV IgG negative IgM.  Negative EBV. Additional labs from 01/13/2024 showed a negative syphilis testing and smear showed teardrop cells/reactive lymphocytes.  CBC and CMP were unremarkable except for mild erythrocytosis. Ultrasound of his liver on 01/01/2024 showed enlarged spleen with estimated volume of 743 mL. He is scheduled for a repeat in a few months.

## 2024-03-18 LAB — ERYTHROPOIETIN: Erythropoietin: 5.3 m[IU]/mL (ref 2.6–18.5)

## 2024-03-23 LAB — BCR-ABL1, CML/ALL, PCR, QUANT
E1A2 Transcript: <0.0032 %
Interpretation (BCRAL):: NEGATIVE
b2a2 transcript: <0.0032 %
b3a2 transcript: <0.0032 %

## 2024-04-11 ENCOUNTER — Inpatient Hospital Stay: Attending: Medical Oncology | Admitting: Oncology

## 2024-07-04 ENCOUNTER — Other Ambulatory Visit (HOSPITAL_COMMUNITY)

## 2024-07-04 ENCOUNTER — Inpatient Hospital Stay

## 2024-07-12 ENCOUNTER — Inpatient Hospital Stay: Admitting: Oncology
